# Patient Record
Sex: Female | Born: 1960
Health system: Southern US, Community
[De-identification: ages and names within clinical notes are randomized; demographics above are authoritative.]

## PROBLEM LIST (undated history)

## (undated) DIAGNOSIS — K219 Gastro-esophageal reflux disease without esophagitis: Secondary | ICD-10-CM

## (undated) DIAGNOSIS — R0602 Shortness of breath: Secondary | ICD-10-CM

## (undated) DIAGNOSIS — R51 Headache: Secondary | ICD-10-CM

## (undated) DIAGNOSIS — R519 Headache, unspecified: Secondary | ICD-10-CM

## (undated) DIAGNOSIS — M1990 Unspecified osteoarthritis, unspecified site: Secondary | ICD-10-CM

## (undated) HISTORY — PX: NO PAST SURGERIES: SHX2092

---

## 2005-05-09 ENCOUNTER — Emergency Department: Payer: Self-pay | Admitting: Emergency Medicine

## 2005-05-09 ENCOUNTER — Other Ambulatory Visit: Payer: Self-pay

## 2005-11-05 ENCOUNTER — Emergency Department: Payer: Self-pay | Admitting: Emergency Medicine

## 2005-11-05 ENCOUNTER — Other Ambulatory Visit: Payer: Self-pay

## 2007-07-13 ENCOUNTER — Emergency Department: Payer: Self-pay | Admitting: Emergency Medicine

## 2010-03-06 ENCOUNTER — Emergency Department: Payer: Self-pay | Admitting: Internal Medicine

## 2011-05-21 ENCOUNTER — Emergency Department: Payer: Self-pay | Admitting: Emergency Medicine

## 2012-06-30 ENCOUNTER — Emergency Department: Payer: Self-pay | Admitting: Emergency Medicine

## 2012-06-30 LAB — LIPASE, BLOOD: Lipase: 232 U/L (ref 73–393)

## 2012-06-30 LAB — URINALYSIS, COMPLETE
Blood: NEGATIVE
Glucose,UR: NEGATIVE mg/dL (ref 0–75)
Ketone: NEGATIVE
Nitrite: NEGATIVE
Ph: 5 (ref 4.5–8.0)
RBC,UR: 2 /HPF (ref 0–5)
WBC UR: 8 /HPF (ref 0–5)

## 2012-07-01 LAB — COMPREHENSIVE METABOLIC PANEL
Alkaline Phosphatase: 117 U/L (ref 50–136)
Anion Gap: 8 (ref 7–16)
Bilirubin,Total: 0.3 mg/dL (ref 0.2–1.0)
Chloride: 109 mmol/L — ABNORMAL HIGH (ref 98–107)
Co2: 24 mmol/L (ref 21–32)
Creatinine: 0.63 mg/dL (ref 0.60–1.30)
EGFR (Non-African Amer.): >60
Glucose: 67 mg/dL (ref 65–99)
Osmolality: 281 (ref 275–301)
Potassium: 3.3 mmol/L — ABNORMAL LOW (ref 3.5–5.1)
Total Protein: 8 g/dL (ref 6.4–8.2)

## 2014-02-23 ENCOUNTER — Emergency Department: Payer: Self-pay | Admitting: Emergency Medicine

## 2014-02-23 LAB — BASIC METABOLIC PANEL
ANION GAP: 7 (ref 7–16)
BUN: 16 mg/dL (ref 7–18)
Calcium, Total: 8.3 mg/dL — ABNORMAL LOW (ref 8.5–10.1)
Chloride: 110 mmol/L — ABNORMAL HIGH (ref 98–107)
Co2: 25 mmol/L (ref 21–32)
Creatinine: 0.73 mg/dL (ref 0.60–1.30)
EGFR (African American): >60
EGFR (Non-African Amer.): >60
GLUCOSE: 102 mg/dL — AB (ref 65–99)
Osmolality: 285 (ref 275–301)
Potassium: 3.6 mmol/L (ref 3.5–5.1)
Sodium: 142 mmol/L (ref 136–145)

## 2014-02-23 LAB — CBC
HCT: 38.6 % (ref 35.0–47.0)
HGB: 12.6 g/dL (ref 12.0–16.0)
MCH: 27.8 pg (ref 26.0–34.0)
MCHC: 32.5 g/dL (ref 32.0–36.0)
MCV: 86 fL (ref 80–100)
PLATELETS: 182 10*3/uL (ref 150–440)
RBC: 4.51 10*6/uL (ref 3.80–5.20)
RDW: 13 % (ref 11.5–14.5)
WBC: 5.7 10*3/uL (ref 3.6–11.0)

## 2014-02-23 LAB — TROPONIN I

## 2014-07-09 ENCOUNTER — Emergency Department (HOSPITAL_COMMUNITY): Payer: Medicaid Other

## 2014-07-09 ENCOUNTER — Emergency Department (HOSPITAL_COMMUNITY)
Admission: EM | Admit: 2014-07-09 | Discharge: 2014-07-09 | Disposition: A | Discharge status: Home / Self Care | Payer: Medicaid Other | Attending: Emergency Medicine | Admitting: Emergency Medicine

## 2014-07-09 ENCOUNTER — Encounter (HOSPITAL_COMMUNITY): Payer: Self-pay | Admitting: *Deleted

## 2014-07-09 DIAGNOSIS — X58XXXA Exposure to other specified factors, initial encounter: Secondary | ICD-10-CM | POA: Insufficient documentation

## 2014-07-09 DIAGNOSIS — R10A0 Flank pain, unspecified side: Secondary | ICD-10-CM

## 2014-07-09 DIAGNOSIS — Y9289 Other specified places as the place of occurrence of the external cause: Secondary | ICD-10-CM | POA: Diagnosis not present

## 2014-07-09 DIAGNOSIS — R109 Unspecified abdominal pain: Secondary | ICD-10-CM

## 2014-07-09 DIAGNOSIS — R079 Chest pain, unspecified: Secondary | ICD-10-CM | POA: Diagnosis not present

## 2014-07-09 DIAGNOSIS — Y9389 Activity, other specified: Secondary | ICD-10-CM | POA: Insufficient documentation

## 2014-07-09 DIAGNOSIS — R52 Pain, unspecified: Secondary | ICD-10-CM

## 2014-07-09 DIAGNOSIS — R112 Nausea with vomiting, unspecified: Secondary | ICD-10-CM | POA: Diagnosis not present

## 2014-07-09 DIAGNOSIS — K5900 Constipation, unspecified: Secondary | ICD-10-CM

## 2014-07-09 DIAGNOSIS — S40022A Contusion of left upper arm, initial encounter: Secondary | ICD-10-CM | POA: Diagnosis not present

## 2014-07-09 DIAGNOSIS — Z3202 Encounter for pregnancy test, result negative: Secondary | ICD-10-CM | POA: Diagnosis not present

## 2014-07-09 DIAGNOSIS — Y998 Other external cause status: Secondary | ICD-10-CM | POA: Insufficient documentation

## 2014-07-09 DIAGNOSIS — G8929 Other chronic pain: Secondary | ICD-10-CM

## 2014-07-09 DIAGNOSIS — R1030 Lower abdominal pain, unspecified: Secondary | ICD-10-CM | POA: Diagnosis not present

## 2014-07-09 HISTORY — DX: Shortness of breath: R06.02

## 2014-07-09 LAB — CBC
HCT: 40.5 % (ref 36.0–46.0)
Hemoglobin: 13.7 g/dL (ref 12.0–15.0)
MCH: 28.2 pg (ref 26.0–34.0)
MCHC: 33.8 g/dL (ref 30.0–36.0)
MCV: 83.5 fL (ref 78.0–100.0)
PLATELETS: 205 10*3/uL (ref 150–400)
RBC: 4.85 MIL/uL (ref 3.87–5.11)
RDW: 12.4 % (ref 11.5–15.5)
WBC: 5.3 10*3/uL (ref 4.0–10.5)

## 2014-07-09 LAB — BASIC METABOLIC PANEL
Anion gap: 11 (ref 5–15)
BUN: 21 mg/dL — ABNORMAL HIGH (ref 6–20)
CHLORIDE: 103 mmol/L (ref 101–111)
CO2: 25 mmol/L (ref 22–32)
CREATININE: 0.78 mg/dL (ref 0.44–1.00)
Calcium: 9.7 mg/dL (ref 8.9–10.3)
Glucose, Bld: 93 mg/dL (ref 65–99)
Potassium: 4 mmol/L (ref 3.5–5.1)
Sodium: 139 mmol/L (ref 135–145)

## 2014-07-09 LAB — URINALYSIS, ROUTINE W REFLEX MICROSCOPIC
Bilirubin Urine: NEGATIVE
GLUCOSE, UA: NEGATIVE mg/dL
HGB URINE DIPSTICK: NEGATIVE
Ketones, ur: NEGATIVE mg/dL
Leukocytes, UA: NEGATIVE
NITRITE: NEGATIVE
Protein, ur: NEGATIVE mg/dL
Specific Gravity, Urine: 1.028 (ref 1.005–1.030)
UROBILINOGEN UA: 0.2 mg/dL (ref 0.0–1.0)
pH: 5 (ref 5.0–8.0)

## 2014-07-09 LAB — I-STAT TROPONIN, ED
TROPONIN I, POC: 0.01 ng/mL (ref 0.00–0.08)
Troponin i, poc: 0.01 ng/mL (ref 0.00–0.08)

## 2014-07-09 LAB — PREGNANCY, URINE: Preg Test, Ur: NEGATIVE

## 2014-07-09 MED ORDER — GI COCKTAIL ~~LOC~~
30.0000 mL | Freq: Once | ORAL | Status: AC
  Administered 2014-07-09: 30 mL via ORAL
  Filled 2014-07-09: qty 30

## 2014-07-09 MED ORDER — POLYETHYLENE GLYCOL 3350 17 GM/SCOOP PO POWD: 17.0000 g | Freq: Two times a day (BID) | ORAL | Status: DC

## 2014-07-09 MED ORDER — OMEPRAZOLE 20 MG PO CPDR: 20.0000 mg | DELAYED_RELEASE_CAPSULE | Freq: Every day | ORAL | Status: DC

## 2014-07-09 MED ORDER — KETOROLAC TROMETHAMINE 30 MG/ML IJ SOLN
30.0000 mg | Freq: Once | INTRAMUSCULAR | Status: AC
  Administered 2014-07-09: 30 mg via INTRAMUSCULAR
  Filled 2014-07-09: qty 1

## 2014-07-09 NOTE — ED Notes (Signed)
Pt's husband not at bedside to assist in translation. Pt does not want to use translator phone, would like husband present.

## 2014-07-09 NOTE — Discharge Instructions (Signed)
Please follow up with your primary care physician in 1-2 days. If you do not have one please call the [facility] number listed above. Please follow up with Dr. Verl Blalock, the heart doctor to schedule a follow up appointment.  Please read all discharge instructions and return precautions.   Chest Pain (Nonspecific) It is often hard to give a specific diagnosis for the cause of chest pain. There is always a chance that your pain could be related to something serious, such as a heart attack or a blood clot in the lungs. You need to follow up with your health care provider for further evaluation. CAUSES   Heartburn.  Pneumonia or bronchitis.  Anxiety or stress.  Inflammation around your heart (pericarditis) or lung (pleuritis or pleurisy).  A blood clot in the lung.  A collapsed lung (pneumothorax). It can develop suddenly on its own (spontaneous pneumothorax) or from trauma to the chest.  Shingles infection (herpes zoster virus). The chest wall is composed of bones, muscles, and cartilage. Any of these can be the source of the pain.  The bones can be bruised by injury.  The muscles or cartilage can be strained by coughing or overwork.  The cartilage can be affected by inflammation and become sore (costochondritis). DIAGNOSIS  Lab tests or other studies may be needed to find the cause of your pain. Your health care provider may have you take a test called an ambulatory electrocardiogram (ECG). An ECG records your heartbeat patterns over a 24-hour period. You may also have other tests, such as:  Transthoracic echocardiogram (TTE). During echocardiography, sound waves are used to evaluate how blood flows through your heart.  Transesophageal echocardiogram (TEE).  Cardiac monitoring. This allows your health care provider to monitor your heart rate and rhythm in real time.  Holter monitor. This is a portable device that records your heartbeat and can help diagnose heart  arrhythmias. It allows your health care provider to track your heart activity for several days, if needed.  Stress tests by exercise or by giving medicine that makes the heart beat faster. TREATMENT   Treatment depends on what may be causing your chest pain. Treatment may include:  Acid blockers for heartburn.  Anti-inflammatory medicine.  Pain medicine for inflammatory conditions.  Antibiotics if an infection is present.  You may be advised to change lifestyle habits. This includes stopping smoking and avoiding alcohol, caffeine, and chocolate.  You may be advised to keep your head raised (elevated) when sleeping. This reduces the chance of acid going backward from your stomach into your esophagus. Most of the time, nonspecific chest pain will improve within 2-3 days with rest and mild pain medicine.  HOME CARE INSTRUCTIONS   If antibiotics were prescribed, take them as directed. Finish them even if you start to feel better.  For the next few days, avoid physical activities that bring on chest pain. Continue physical activities as directed.  Do not use any tobacco products, including cigarettes, chewing tobacco, or electronic cigarettes.  Avoid drinking alcohol.  Only take medicine as directed by your health care provider.  Follow your health care provider's suggestions for further testing if your chest pain does not go away.  Keep any follow-up appointments you made. If you do not go to an appointment, you could develop lasting (chronic) problems with pain. If there is any problem keeping an appointment, call to reschedule. SEEK MEDICAL CARE IF:   Your chest pain does not go away, even after treatment.  You have a rash with blisters on your chest.  You have a fever. SEEK IMMEDIATE MEDICAL CARE IF:   You have increased chest pain or pain that spreads to your arm, neck, jaw, back, or abdomen.  You have shortness of breath.  You have an increasing cough, or you cough up  blood.  You have severe back or abdominal pain.  You feel nauseous or vomit.  You have severe weakness.  You faint.  You have chills. This is an emergency. Do not wait to see if the pain will go away. Get medical help at once. Call your local emergency services (911 in [place] Do not drive yourself to the hospital. MAKE SURE YOU:   Understand these instructions.  Will watch your condition.  Will get help right away if you are not doing well or get worse. Document Released: 11/08/2004 Document Revised: 02/03/2013 Document Reviewed: 09/04/2007 [facility] Patient Information 2015 [place], [place]. This information is not intended to replace advice given to you by your health care provider. Make sure you discuss any questions you have with your health care provider.  Abdominal Pain, Women Abdominal (stomach, pelvic, or belly) pain can be caused by many things. It is important to tell your doctor:  The location of the pain.  Does it come and go or is it present all the time?  Are there things that start the pain (eating certain foods, exercise)?  Are there other symptoms associated with the pain (fever, nausea, vomiting, diarrhea)? All of this is helpful to know when trying to find the cause of the pain. CAUSES   Stomach: virus or bacteria infection, or ulcer.  Intestine: appendicitis (inflamed appendix), regional ileitis (Crohn's disease), ulcerative colitis (inflamed colon), irritable bowel syndrome, diverticulitis (inflamed diverticulum of the colon), or cancer of the stomach or intestine.  Gallbladder disease or stones in the gallbladder.  Kidney disease, kidney stones, or infection.  Pancreas infection or cancer.  Fibromyalgia (pain disorder).  Diseases of the female organs:  Uterus: fibroid (non-cancerous) tumors or infection.  Fallopian tubes: infection or tubal pregnancy.  Ovary: cysts or tumors.  Pelvic adhesions (scar tissue).  Endometriosis (uterus lining  tissue growing in the pelvis and on the pelvic organs).  Pelvic congestion syndrome (female organs filling up with blood just before the menstrual period).  Pain with the menstrual period.  Pain with ovulation (producing an egg).  Pain with an IUD (intrauterine device, birth control) in the uterus.  Cancer of the female organs.  Functional pain (pain not caused by a disease, may improve without treatment).  Psychological pain.  Depression. DIAGNOSIS  Your doctor will decide the seriousness of your pain by doing an examination.  Blood tests.  X-rays.  Ultrasound.  CT scan (computed tomography, special type of X-ray).  MRI (magnetic resonance imaging).  Cultures, for infection.  Barium enema (dye inserted in the large intestine, to better view it with X-rays).  Colonoscopy (looking in intestine with a lighted tube).  Laparoscopy (minor surgery, looking in abdomen with a lighted tube).  Major abdominal exploratory surgery (looking in abdomen with a large incision). TREATMENT  The treatment will depend on the cause of the pain.   Many cases can be observed and treated at home.  Over-the-counter medicines recommended by your caregiver.  Prescription medicine.  Antibiotics, for infection.  Birth control pills, for painful periods or for ovulation pain.  Hormone treatment, for endometriosis.  Nerve blocking injections.  Physical therapy.  Antidepressants.  Counseling with a psychologist or psychiatrist.  Minor or major surgery. HOME CARE INSTRUCTIONS   Do not take laxatives, unless directed by your caregiver.  Take over-the-counter pain medicine only if ordered by your caregiver. Do not take aspirin because it can cause an upset stomach or bleeding.  Try a clear liquid diet (broth or water) as ordered by your caregiver. Slowly move to a bland diet, as tolerated, if the pain is related to the stomach or intestine.  Have a thermometer and take your  temperature several times a day, and record it.  Bed rest and sleep, if it helps the pain.  Avoid sexual intercourse, if it causes pain.  Avoid stressful situations.  Keep your follow-up appointments and tests, as your caregiver orders.  If the pain does not go away with medicine or surgery, you may try:  Acupuncture.  Relaxation exercises (yoga, meditation).  Group therapy.  Counseling. SEEK MEDICAL CARE IF:   You notice certain foods cause stomach pain.  Your home care treatment is not helping your pain.  You need stronger pain medicine.  You want your IUD removed.  You feel faint or lightheaded.  You develop nausea and vomiting.  You develop a rash.  You are having side effects or an allergy to your medicine. SEEK IMMEDIATE MEDICAL CARE IF:   Your pain does not go away or gets worse.  You have a fever.  Your pain is felt only in portions of the abdomen. The right side could possibly be appendicitis. The left lower portion of the abdomen could be colitis or diverticulitis.  You are passing blood in your stools (bright red or black tarry stools, with or without vomiting).  You have blood in your urine.  You develop chills, with or without a fever.  You pass out. MAKE SURE YOU:   Understand these instructions.  Will watch your condition.  Will get help right away if you are not doing well or get worse. Document Released: 11/26/2006 Document Revised: 06/15/2013 Document Reviewed: 12/16/2008 [facility] Patient Information 2015 [place], [place]. This information is not intended to replace advice given to you by your health care provider. Make sure you discuss any questions you have with your health care provider.

## 2014-07-09 NOTE — ED Provider Notes (Signed)
CSN: [id number]     Arrival date & time 07/09/14  1115 History   First MD Initiated Contact with Patient 07/09/14 1349     Chief Complaint  Patient presents with  . Chest Pain  . Flank Pain     (Consider location/radiation/quality/duration/timing/severity/associated sxs/prior Treatment) HPI Comments: Patient is 54 year old female presenting to the emergency department for multiple complaints. Patient is complaining of central chest pain, burning, constant over the last 3 months. Patient describes it as starting in her belly button and going up to her chest. She states she has been seen twice before with negative cardiac workups and was advised to follow-up with PCP or cardiologist outpatient but has been unable to do so. Denies any changes in her symptoms today. She is also complaining of left flank pain that has been going on and off for a year. She states she's also having associated constipation with this as well as nausea and intermittent vomiting. No vomiting the last few days. Patient is also concerned about a small bruise and swelling in her left elbow. She states that began since she left last ER. No modifying factors identified.  Patient is a 54 y.o. female presenting with chest pain and flank pain.  Chest Pain Associated symptoms: nausea and vomiting   Flank Pain Associated symptoms include chest pain, myalgias, nausea and vomiting.    Past Medical History  Diagnosis Date  . SOB (shortness of breath)    History reviewed. No pertinent past surgical history. No family history on file. History  Substance Use Topics  . Smoking status: Never Smoker   . Smokeless tobacco: Not on file  . Alcohol Use: No   OB History    No data available     Review of Systems  Cardiovascular: Positive for chest pain.  Gastrointestinal: Positive for nausea and vomiting.  Genitourinary: Positive for flank pain.  Musculoskeletal: Positive for myalgias.  All other systems reviewed and are  negative.     Allergies  Review of patient's allergies indicates no known allergies.  Home Medications   Prior to Admission medications   Medication Sig Start Date End Date Taking? Authorizing Provider  omeprazole (PRILOSEC) 20 MG capsule Take 1 capsule (20 mg total) by mouth daily. 07/09/14   Kadyn Chovan, PA-C  polyethylene glycol powder (GLYCOLAX/MIRALAX) powder Take 17 g by mouth 2 (two) times daily. Until daily soft stools  OTC 07/09/14   Riki Gehring, PA-C   BP 113/74 mmHg  Pulse 58  Temp(Src) 98.2 F (36.8 C) (Oral)  Resp 18  Ht 5\' 8"  (1.727 m)  Wt 209 lb 7 oz (95 kg)  BMI 31.85 kg/m2  SpO2 100%  LMP 06/25/2014 Physical Exam  Constitutional: She is oriented to person, place, and time. She appears well-developed and well-nourished. No distress.  HENT:  Head: Normocephalic and atraumatic.  Right Ear: External ear normal.  Left Ear: External ear normal.  Nose: Nose normal.  Mouth/Throat: Oropharynx is clear and moist.  Eyes: Conjunctivae are normal.  Neck: Normal range of motion. Neck supple.  No nuchal rigidity.   Cardiovascular: Normal rate, regular rhythm and normal heart sounds.   Pulmonary/Chest: Effort normal and breath sounds normal. No respiratory distress.  Abdominal: Soft. Bowel sounds are normal. There is no tenderness.  Musculoskeletal: Normal range of motion. She exhibits no edema.       Arms: Neurological: She is alert and oriented to person, place, and time.  Skin: Skin is warm and dry. She is not diaphoretic.  Psychiatric:  She has a normal mood and affect.  Nursing note and vitals reviewed.   ED Course  Procedures (including critical care time) Medications  gi cocktail (Maalox,Lidocaine,Donnatal) (30 mLs Oral Given 07/09/14 1458)  ketorolac (TORADOL) 30 MG/ML injection 30 mg (30 mg Intramuscular Given 07/09/14 1501)    Labs Review Labs Reviewed  BASIC METABOLIC PANEL - Abnormal; Notable for the following:    BUN 21 (*)    All  other components within normal limits  URINALYSIS, ROUTINE W REFLEX MICROSCOPIC (NOT AT [facility]) - Abnormal; Notable for the following:    Color, Urine AMBER (*)    All other components within normal limits  CBC  PREGNANCY, URINE  I-STAT TROPOININ, ED  I-STAT TROPOININ, ED    Imaging Review Dg Chest 2 View  07/09/2014   CLINICAL DATA:  Central chest pain, left flank pain, constipation, left arm swelling, nausea and vomiting for 3 months.  EXAM: CHEST  2 VIEW  COMPARISON:  Radiographs 02/23/2014 and 07/01/2012.  FINDINGS: The heart size and mediastinal contours are stable allowing for lordotic positioning on the AP view. The lungs are clear. There is no pleural effusion or pneumothorax. No acute osseous findings are demonstrated.  IMPRESSION: No active cardiopulmonary process.   Electronically Signed   By: Richardean Sale M.D.   On: 07/09/2014 12:01   Dg Elbow Complete Left  07/09/2014   CLINICAL DATA:  Left elbow pain for 3 months.  No known injury.  EXAM: LEFT ELBOW - COMPLETE 3+ VIEW  COMPARISON:  None.  FINDINGS: The joint spaces are maintained. No acute fracture is identified. There is a rounded bony density near the lateral epicondyle which could reflect a T unfused secondary ossification center, old avulsion injury or calcific tendinitis. No osteochondral abnormality. No joint effusion.  IMPRESSION: No acute bony findings, significant degenerative changes or joint effusion.   Electronically Signed   By: Marijo Sanes M.D.   On: 07/09/2014 15:07   Ct Renal Stone Study  07/09/2014   CLINICAL DATA:  Bilateral flank pain.  EXAM: CT ABDOMEN AND PELVIS WITHOUT CONTRAST  TECHNIQUE: Multidetector CT imaging of the abdomen and pelvis was performed following the standard protocol without IV contrast.  COMPARISON:  None.  FINDINGS: Mild degenerative disc disease is noted at L4-5. Visualized lung bases appear normal.  No gallstones are noted. No focal abnormality is noted in the liver, spleen or pancreas on  these unenhanced images. Adrenal glands and kidneys appear normal. No hydronephrosis or renal obstruction is noted. No renal or ureteral calculi are noted. The appendix appears normal. There is no evidence of bowel obstruction. No abnormal fluid collection is noted. Urinary bladder appears normal. Uterus and ovaries appear normal. No significant adenopathy is noted.  IMPRESSION: No significant abnormality seen in the abdomen or pelvis. No hydronephrosis or renal obstruction is noted. No renal or ureteral calculi are noted.   Electronically Signed   By: Marijo Conception, M.D.   On: 07/09/2014 15:54     EKG Interpretation   Date/Time:  Friday Jul 09 2014 11:30:46 EDT Ventricular Rate:  84 PR Interval:  140 QRS Duration: 66 QT Interval:  350 QTC Calculation: 413 R Axis:   81 Text Interpretation:  Normal sinus rhythm Normal ECG Confirmed by BEATON   MD, ROBERT ([id number]) on 07/09/2014 4:19:31 PM      MDM   Final diagnoses:  Flank pain  Constipation, unspecified constipation type  Chronic chest pain    Filed Vitals:   07/09/14 1658  BP: 113/74  Pulse: 58  Temp: 98.2 F (36.8 C)  Resp: 18   Afebrile, NAD, non-toxic appearing, AAOx4. I have reviewed nursing notes, vital signs, and all appropriate lab and imaging results if ordered as above.   1) CP: Patient is to be discharged with recommendation to follow up with PCP/cardiology in regards to today's hospital visit for chest pain x 3 months. Chest pain is not likely of cardiac or pulmonary etiology d/t presentation, low suspicion for PE no hypoxia tachycardia or tachypnea, VSS, no tracheal deviation, no JVD or new murmur, RRR, breath sounds equal bilaterally, EKG without acute abnormalities, negative delta troponin, and negative CXR. Pt has been to return to the ED is CP becomes exertional, associated with diaphoresis or nausea, radiates to left jaw/arm, worsens or becomes concerning in any way.  2) Flank Pain: Abdomen soft, non-tender,  non-distended. No peritoneal signs. No CVA tenderness. UA without UTI. CT scan reviewed w/o acute abnormality. Will place patient on Miralax for constipation.   3) Left arm pain: Small hematoma noted. No evidence of superimposed infection. Patient X-Ray negative for obvious fracture or dislocation. Neurovascularly intact. Normal sensation. No evidence of compartment syndrome. Pain managed in ED. Pt advised to follow up with PCP if symptoms persist for possibility of missed fracture diagnosis.  Return precautions discussed. Patient is agreeable to plan. Patient is stable at time of discharge   Baron Sane, PA-C 07/09/14 [place], MD 07/13/14 [phone]

## 2014-07-09 NOTE — ED Notes (Signed)
Pt has multiple complaints. Pt reports central chest pain, left flank pain, constipation, left arm swelling and n/v. This has been ongoing and she has seen dr 3 months ago for same.

## 2014-08-04 ENCOUNTER — Encounter: Payer: Self-pay | Admitting: Cardiology

## 2014-08-04 ENCOUNTER — Ambulatory Visit: Payer: Medicaid Other | Attending: Cardiology | Admitting: Cardiology

## 2014-08-04 VITALS — BP 114/77 | HR 73 | Temp 98.1°F | Resp 18 | Ht 65.0 in | Wt 212.0 lb

## 2014-08-04 DIAGNOSIS — R0602 Shortness of breath: Secondary | ICD-10-CM | POA: Diagnosis not present

## 2014-08-04 DIAGNOSIS — M549 Dorsalgia, unspecified: Secondary | ICD-10-CM | POA: Diagnosis not present

## 2014-08-04 DIAGNOSIS — R079 Chest pain, unspecified: Secondary | ICD-10-CM | POA: Insufficient documentation

## 2014-08-04 DIAGNOSIS — Z79899 Other long term (current) drug therapy: Secondary | ICD-10-CM | POA: Insufficient documentation

## 2014-08-04 DIAGNOSIS — K219 Gastro-esophageal reflux disease without esophagitis: Secondary | ICD-10-CM | POA: Diagnosis not present

## 2014-08-04 MED ORDER — OMEPRAZOLE 20 MG PO CPDR: 20.0000 mg | DELAYED_RELEASE_CAPSULE | Freq: Every day | ORAL | Status: DC
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
## 2014-08-04 NOTE — Addendum Note (Signed)
Addended by: Sharman Crate A on: 08/04/2014 12:17 PM   Modules accepted: Orders

## 2014-08-04 NOTE — Progress Notes (Signed)
Patient reports chest pain for a very long time, 10 years. Chest pain is mid-sternal. Feels like an axe, first thing in the morning. Patient reports swelling right above belly button. Patient gets constipated, feels like something is moving in intestines. Left arm was swollen, patient reports sometimes left arm does not work, sometimes right arm does not work.   Free clinic in [facility] told patient she was pretty close to heart attack.   Husband passed 9 months ago.   Patient left medications at home from doctor in [place], causes nausea and vomiting.

## 2014-08-04 NOTE — Patient Instructions (Signed)
Thank you for coming in today. Please pick up your prescription for omeprazole at the pharmacy here. Make appointment with PCP to establish care.

## 2014-08-09 ENCOUNTER — Ambulatory Visit: Payer: Self-pay

## 2014-08-10 ENCOUNTER — Ambulatory Visit: Payer: Self-pay | Attending: Internal Medicine | Admitting: Clinical

## 2014-08-10 ENCOUNTER — Ambulatory Visit: Payer: Self-pay

## 2014-08-10 DIAGNOSIS — Z659 Problem related to unspecified psychosocial circumstances: Secondary | ICD-10-CM
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
## 2014-09-02 ENCOUNTER — Telehealth: Payer: Self-pay | Admitting: Cardiology

## 2014-09-02 NOTE — Telephone Encounter (Signed)
Patient called to request a med refill for Prilosec, please f/u with pt.

## 2014-09-07 ENCOUNTER — Ambulatory Visit: Payer: Self-pay

## 2014-10-04 LAB — BASIC METABOLIC PANEL
BUN: 26 — AB (ref 4–21)
Glucose: 103

## 2014-10-27 ENCOUNTER — Telehealth: Payer: Self-pay | Admitting: Internal Medicine

## 2014-10-27 NOTE — Telephone Encounter (Signed)
Patient's Son called requesting a medication refill for omeprazole (PRILOSEC). Please follow up.

## 2015-01-04 ENCOUNTER — Ambulatory Visit: Payer: Medicaid Other | Admitting: Family Medicine

## 2015-01-10 ENCOUNTER — Ambulatory Visit: Payer: Medicaid Other | Attending: Family Medicine | Admitting: Family Medicine

## 2015-01-10 ENCOUNTER — Encounter: Payer: Self-pay | Admitting: Family Medicine

## 2015-01-10 ENCOUNTER — Encounter: Payer: Self-pay | Admitting: Clinical

## 2015-01-10 VITALS — BP 120/82 | HR 70 | Temp 98.2°F | Resp 14 | Ht 66.0 in | Wt 214.2 lb

## 2015-01-10 DIAGNOSIS — F4310 Post-traumatic stress disorder, unspecified: Secondary | ICD-10-CM | POA: Diagnosis not present

## 2015-01-10 DIAGNOSIS — K5900 Constipation, unspecified: Secondary | ICD-10-CM | POA: Diagnosis not present

## 2015-01-10 DIAGNOSIS — Z13220 Encounter for screening for lipoid disorders: Secondary | ICD-10-CM

## 2015-01-10 DIAGNOSIS — K219 Gastro-esophageal reflux disease without esophagitis: Secondary | ICD-10-CM | POA: Insufficient documentation

## 2015-01-10 DIAGNOSIS — F329 Major depressive disorder, single episode, unspecified: Secondary | ICD-10-CM

## 2015-01-10 DIAGNOSIS — R1084 Generalized abdominal pain: Secondary | ICD-10-CM | POA: Diagnosis not present

## 2015-01-10 DIAGNOSIS — F419 Anxiety disorder, unspecified: Principal | ICD-10-CM

## 2015-01-10 DIAGNOSIS — R109 Unspecified abdominal pain: Secondary | ICD-10-CM | POA: Insufficient documentation

## 2015-01-10 MED ORDER — LACTULOSE 10 GM/15ML PO SOLN: 10.0000 g | Freq: Three times a day (TID) | ORAL | Status: DC

## 2015-01-10 NOTE — Patient Instructions (Signed)
Food Choices for Gastroesophageal Reflux Disease, Adult When you have gastroesophageal reflux disease (GERD), the foods you eat and your eating habits are very important. Choosing the right foods can help ease the discomfort of GERD. WHAT GENERAL GUIDELINES DO I NEED TO FOLLOW?  Choose fruits, vegetables, whole grains, low-fat dairy products, and low-fat meat, fish, and poultry.  Limit fats such as oils, salad dressings, butter, nuts, and avocado.  Keep a food diary to identify foods that cause symptoms.  Avoid foods that cause reflux. These may be different for different people.  Eat frequent small meals instead of three large meals each day.  Eat your meals slowly, in a relaxed setting.  Limit fried foods.  Cook foods using methods other than frying.  Avoid drinking alcohol.  Avoid drinking large amounts of liquids with your meals.  Avoid bending over or lying down until 2-3 hours after eating. WHAT FOODS ARE NOT RECOMMENDED? The following are some foods and drinks that may worsen your symptoms: Vegetables Tomatoes. Tomato juice. Tomato and spaghetti sauce. Chili peppers. Onion and garlic. Horseradish. Fruits Oranges, grapefruit, and lemon (fruit and juice). Meats High-fat meats, fish, and poultry. This includes hot dogs, ribs, ham, sausage, salami, and bacon. Dairy Whole milk and chocolate milk. Sour cream. Cream. Butter. Ice cream. Cream cheese.  Beverages Coffee and tea, with or without caffeine. Carbonated beverages or energy drinks. Condiments Hot sauce. Barbecue sauce.  Sweets/Desserts Chocolate and cocoa. Donuts. Peppermint and spearmint. Fats and Oils High-fat foods, including French fries and potato chips. Other Vinegar. Strong spices, such as black pepper, white pepper, red pepper, cayenne, curry powder, cloves, ginger, and chili powder. The items listed above may not be a complete list of foods and beverages to avoid. Contact your dietitian for more  information.   This information is not intended to replace advice given to you by your health care provider. Make sure you discuss any questions you have with your health care provider.   Document Released: 01/29/2005 Document Revised: 02/19/2014 Document Reviewed: 12/03/2012 Elsevier Interactive Patient Education 2016 Elsevier Inc.  
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
## 2015-01-10 NOTE — Progress Notes (Unsigned)
ASSESSMENT: Pt currently experiencing symptoms of anxiety and depression. Pt needs to f/u with PCP and [facility]; would benefit from continued supportive counseling regarding coping with symptoms of anxiety and depression, as well as referral to psychiatry for [facility] med management.  Stage of Change: precontemplative  PLAN: 1. F/U with behavioral health consultant at next office visit w physician 2. Psychiatric Medications: none. 3. Behavioral recommendation(s):   -Consider walk-in clinic at [facility] for [facility] med management -Consider accepting Home Health aide by next office visit, if physician advises SUBJECTIVE: Pt. referred by Dr Jarold Song for symptoms of anxiety and depression:  Pt. reports the following symptoms/concerns: Pt states that her primary source of depression and anxiety are that she worries about her health. She feels somewhat hopeful in that her Medicaid has been approved, but she is uncertain what that means as far as obtaining a PCP and referrals to a specialist who can put a camera in her chest, to make sure she doesn't have cancer. She also worries that her son is not married; her son says he is concerned that there are no sisters or wives in the family to take care of his mother while he is at work.  Duration of problem: Recent since Medicaid has been approved Severity: severe  OBJECTIVE: Orientation & Cognition: Oriented x3. Thought processes normal and appropriate to situation. Mood: appropriate. Affect: appropriate Appearance: appropriate Risk of harm to self or others: no known risk of harm to self or others, no suicidal ideation Substance use: none Assessments administered: PHQ9: 25/ GAD7: 21  Diagnosis: Anxiety and depression CPT Code: F41.8 -------------------------------------------- Other(s) present in the room: grown son  Time spent with patient in exam room: 20 minutes
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
## 2015-01-11 ENCOUNTER — Encounter: Payer: Self-pay | Admitting: Physician Assistant

## 2015-01-11 ENCOUNTER — Other Ambulatory Visit: Payer: Medicaid Other

## 2015-01-11 DIAGNOSIS — F4310 Post-traumatic stress disorder, unspecified: Secondary | ICD-10-CM | POA: Insufficient documentation

## 2015-02-02 ENCOUNTER — Other Ambulatory Visit (INDEPENDENT_AMBULATORY_CARE_PROVIDER_SITE_OTHER): Payer: Medicaid Other

## 2015-02-02 ENCOUNTER — Ambulatory Visit (INDEPENDENT_AMBULATORY_CARE_PROVIDER_SITE_OTHER): Payer: Medicaid Other | Admitting: Physician Assistant

## 2015-02-02 ENCOUNTER — Encounter: Payer: Self-pay | Admitting: Physician Assistant

## 2015-02-02 VITALS — BP 116/70 | HR 78 | Ht 66.0 in | Wt 213.0 lb

## 2015-02-02 DIAGNOSIS — R112 Nausea with vomiting, unspecified: Secondary | ICD-10-CM

## 2015-02-02 DIAGNOSIS — G8929 Other chronic pain: Secondary | ICD-10-CM

## 2015-02-02 DIAGNOSIS — Z1211 Encounter for screening for malignant neoplasm of colon: Secondary | ICD-10-CM

## 2015-02-02 DIAGNOSIS — R1010 Upper abdominal pain, unspecified: Secondary | ICD-10-CM

## 2015-02-02 DIAGNOSIS — R1310 Dysphagia, unspecified: Secondary | ICD-10-CM

## 2015-02-02 DIAGNOSIS — K219 Gastro-esophageal reflux disease without esophagitis: Secondary | ICD-10-CM

## 2015-02-02 DIAGNOSIS — R1013 Epigastric pain: Secondary | ICD-10-CM

## 2015-02-02 DIAGNOSIS — K5900 Constipation, unspecified: Secondary | ICD-10-CM

## 2015-02-02 DIAGNOSIS — R1011 Right upper quadrant pain: Secondary | ICD-10-CM

## 2015-02-02 LAB — BASIC METABOLIC PANEL
BUN: 22 mg/dL (ref 6–23)
CALCIUM: 10 mg/dL (ref 8.4–10.5)
CO2: 29 meq/L (ref 19–32)
Chloride: 106 mEq/L (ref 96–112)
Creatinine, Ser: 0.74 mg/dL (ref 0.40–1.20)
GFR: 87.46 mL/min (ref >60.00)
GLUCOSE: 103 mg/dL — AB (ref 70–99)
Potassium: 5.1 mEq/L (ref 3.5–5.1)
SODIUM: 143 meq/L (ref 135–145)

## 2015-02-02 LAB — HEPATIC FUNCTION PANEL
ALT: 17 U/L (ref 0–35)
AST: 21 U/L (ref 0–37)
Albumin: 4.4 g/dL (ref 3.5–5.2)
Alkaline Phosphatase: 84 U/L (ref 39–117)
BILIRUBIN DIRECT: 0 mg/dL (ref 0.0–0.3)
BILIRUBIN TOTAL: 0.4 mg/dL (ref 0.2–1.2)
Total Protein: 7.5 g/dL (ref 6.0–8.3)

## 2015-02-02 LAB — URINALYSIS, ROUTINE W REFLEX MICROSCOPIC
Bilirubin Urine: NEGATIVE
HGB URINE DIPSTICK: NEGATIVE
Ketones, ur: NEGATIVE
NITRITE: NEGATIVE
RBC / HPF: NONE SEEN (ref 0–?)
Specific Gravity, Urine: >=1.03 — AB (ref 1.000–1.030)
TOTAL PROTEIN, URINE-UPE24: NEGATIVE
URINE GLUCOSE: NEGATIVE
UROBILINOGEN UA: 0.2 (ref 0.0–1.0)
pH: 5.5 (ref 5.0–8.0)

## 2015-02-02 LAB — CBC WITH DIFFERENTIAL/PLATELET
Basophils Absolute: 0 10*3/uL (ref 0.0–0.1)
Basophils Relative: 0.4 % (ref 0.0–3.0)
EOS PCT: 1.7 % (ref 0.0–5.0)
Eosinophils Absolute: 0.1 10*3/uL (ref 0.0–0.7)
HCT: 40.3 % (ref 36.0–46.0)
HEMOGLOBIN: 13.4 g/dL (ref 12.0–15.0)
LYMPHS PCT: 33.5 % (ref 12.0–46.0)
Lymphs Abs: 2 10*3/uL (ref 0.7–4.0)
MCHC: 33.3 g/dL (ref 30.0–36.0)
MCV: 84.2 fl (ref 78.0–100.0)
MONO ABS: 0.5 10*3/uL (ref 0.1–1.0)
MONOS PCT: 8.7 % (ref 3.0–12.0)
Neutro Abs: 3.3 10*3/uL (ref 1.4–7.7)
Neutrophils Relative %: 55.7 % (ref 43.0–77.0)
Platelets: 196 10*3/uL (ref 150.0–400.0)
RBC: 4.78 Mil/uL (ref 3.87–5.11)
RDW: 13.5 % (ref 11.5–15.5)
WBC: 6 10*3/uL (ref 4.0–10.5)

## 2015-02-02 LAB — LIPASE: Lipase: 27 U/L (ref 11.0–59.0)

## 2015-02-02 LAB — AMYLASE: Amylase: 50 U/L (ref 27–131)

## 2015-02-02 LAB — TSH: TSH: 0.84 u[IU]/mL (ref 0.35–4.50)

## 2015-02-02 MED ORDER — DOCUSATE CALCIUM 240 MG PO CAPS: 240.0000 mg | ORAL_CAPSULE | Freq: Every day | ORAL | Status: DC

## 2015-02-02 MED ORDER — NA SULFATE-K SULFATE-MG SULF 17.5-3.13-1.6 GM/177ML PO SOLN: 1.0000 | ORAL | Status: AC

## 2015-02-02 MED ORDER — PANTOPRAZOLE SODIUM 40 MG PO TBEC: 40.0000 mg | DELAYED_RELEASE_TABLET | Freq: Two times a day (BID) | ORAL | Status: DC
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
## 2015-02-02 NOTE — Patient Instructions (Signed)
You have been scheduled for a colonoscopy. Please follow written instructions given to you at your visit today.  Please pick up your prep supplies at the pharmacy within the next 1-3 days. If you use inhalers (even only as needed), please bring them with you on the day of your procedure.   You have been scheduled for a Barium Esophogram at [facility] (1st floor of the hospital) on 02-08-15 at 11:30am. Please arrive 15 minutes prior to your appointment for registration. Make certain not to have anything to eat or drink 6 hours prior to your test. If you need to reschedule for any reason, please contact radiology at [phone] to do so. __________________________________________________________________ A barium swallow is an examination that concentrates on views of the esophagus. This tends to be a double contrast exam (barium and two liquids which, when combined, create a gas to distend the wall of the oesophagus) or single contrast (non-ionic iodine based). The study is usually tailored to your symptoms so a good history is essential. Attention is paid during the study to the form, structure and configuration of the esophagus, looking for functional disorders (such as aspiration, dysphagia, achalasia, motility and reflux) EXAMINATION You may be asked to change into a gown, depending on the type of swallow being performed. A radiologist and radiographer will perform the procedure. The radiologist will advise you of the type of contrast selected for your procedure and direct you during the exam. You will be asked to stand, sit or lie in several different positions and to hold a small amount of fluid in your mouth before being asked to swallow while the imaging is performed .In some instances you may be asked to swallow barium coated marshmallows to assess the motility of a solid food bolus. The exam can be recorded as a digital or video fluoroscopy procedure. POST PROCEDURE It will take 1-2  days for the barium to pass through your system. To facilitate this, it is important, unless otherwise directed, to increase your fluids for the next 24-48hrs and to resume your normal diet.  This test typically takes about 30 minutes to perform. __________________________________________________________________________________  Regina Sparks have been scheduled for an abdominal ultrasound at [facility] (1st floor of hospital) on 02-04-2015 at 7:30am . Please arrive 15 minutes prior to your appointment for registration. Make certain not to have anything to eat or drink 6 hours prior to your appointment. Should you need to reschedule your appointment, please contact radiology at [phone]. This test typically takes about 30 minutes to perform.   We have sent medications to your pharmacy for you to pick up at your convenience.  Please stop taking the Omeprazole.  Your physician has requested that you go to the basement for lab work before leaving today.

## 2015-02-03 LAB — CULTURE, URINE COMPREHENSIVE
Colony Count: NO GROWTH
ORGANISM ID, BACTERIA: NO GROWTH

## 2015-02-03 NOTE — Progress Notes (Signed)
i agree with the above note, plan 

## 2015-02-04 ENCOUNTER — Ambulatory Visit (HOSPITAL_COMMUNITY): Payer: Medicaid Other

## 2015-02-08 ENCOUNTER — Ambulatory Visit (HOSPITAL_COMMUNITY): Admission: RE | Admit: 2015-02-08 | Payer: Medicaid Other | Source: Ambulatory Visit

## 2015-02-09 ENCOUNTER — Ambulatory Visit (HOSPITAL_COMMUNITY): Admission: RE | Admit: 2015-02-09 | Payer: Medicaid Other | Source: Ambulatory Visit

## 2015-02-09 ENCOUNTER — Ambulatory Visit (HOSPITAL_COMMUNITY): Payer: Medicaid Other

## 2015-02-16 ENCOUNTER — Ambulatory Visit (HOSPITAL_COMMUNITY)
Admission: RE | Admit: 2015-02-16 | Discharge: 2015-02-16 | Disposition: A | Discharge status: Home / Self Care | Payer: Medicaid Other | Source: Ambulatory Visit | Attending: Physician Assistant | Admitting: Physician Assistant

## 2015-02-16 DIAGNOSIS — K5900 Constipation, unspecified: Secondary | ICD-10-CM | POA: Diagnosis not present

## 2015-02-16 DIAGNOSIS — R1011 Right upper quadrant pain: Secondary | ICD-10-CM | POA: Diagnosis not present

## 2015-02-16 DIAGNOSIS — R1013 Epigastric pain: Secondary | ICD-10-CM

## 2015-02-16 DIAGNOSIS — Z1211 Encounter for screening for malignant neoplasm of colon: Secondary | ICD-10-CM

## 2015-02-16 DIAGNOSIS — K449 Diaphragmatic hernia without obstruction or gangrene: Secondary | ICD-10-CM | POA: Insufficient documentation

## 2015-02-16 DIAGNOSIS — R1310 Dysphagia, unspecified: Secondary | ICD-10-CM | POA: Insufficient documentation

## 2015-02-16 DIAGNOSIS — K219 Gastro-esophageal reflux disease without esophagitis: Secondary | ICD-10-CM | POA: Insufficient documentation

## 2015-02-16 DIAGNOSIS — G8929 Other chronic pain: Secondary | ICD-10-CM

## 2015-02-16 DIAGNOSIS — R112 Nausea with vomiting, unspecified: Secondary | ICD-10-CM | POA: Insufficient documentation

## 2015-02-16 DIAGNOSIS — K8020 Calculus of gallbladder without cholecystitis without obstruction: Secondary | ICD-10-CM | POA: Diagnosis not present

## 2015-02-16 DIAGNOSIS — R042 Hemoptysis: Secondary | ICD-10-CM | POA: Insufficient documentation

## 2015-02-18 ENCOUNTER — Ambulatory Visit (HOSPITAL_COMMUNITY): Payer: Medicaid Other

## 2015-02-23 ENCOUNTER — Ambulatory Visit (HOSPITAL_COMMUNITY)
Admission: RE | Admit: 2015-02-23 | Discharge: 2015-02-23 | Disposition: A | Discharge status: Home / Self Care | Payer: Medicaid Other | Source: Ambulatory Visit | Attending: Physician Assistant | Admitting: Physician Assistant

## 2015-02-23 DIAGNOSIS — R1013 Epigastric pain: Secondary | ICD-10-CM | POA: Diagnosis not present

## 2015-02-23 DIAGNOSIS — R109 Unspecified abdominal pain: Secondary | ICD-10-CM | POA: Diagnosis present

## 2015-02-23 DIAGNOSIS — Z1211 Encounter for screening for malignant neoplasm of colon: Secondary | ICD-10-CM | POA: Diagnosis not present

## 2015-02-23 DIAGNOSIS — R1310 Dysphagia, unspecified: Secondary | ICD-10-CM | POA: Diagnosis not present

## 2015-02-23 DIAGNOSIS — R079 Chest pain, unspecified: Secondary | ICD-10-CM | POA: Diagnosis not present

## 2015-02-23 DIAGNOSIS — R1010 Upper abdominal pain, unspecified: Secondary | ICD-10-CM | POA: Diagnosis not present

## 2015-02-23 DIAGNOSIS — R112 Nausea with vomiting, unspecified: Secondary | ICD-10-CM | POA: Diagnosis not present

## 2015-02-23 DIAGNOSIS — K219 Gastro-esophageal reflux disease without esophagitis: Secondary | ICD-10-CM | POA: Insufficient documentation

## 2015-02-23 DIAGNOSIS — K8020 Calculus of gallbladder without cholecystitis without obstruction: Secondary | ICD-10-CM | POA: Diagnosis not present

## 2015-02-23 DIAGNOSIS — K5900 Constipation, unspecified: Secondary | ICD-10-CM | POA: Insufficient documentation

## 2015-02-23 DIAGNOSIS — G8929 Other chronic pain: Secondary | ICD-10-CM | POA: Insufficient documentation

## 2015-03-09 ENCOUNTER — Ambulatory Visit: Payer: Self-pay | Admitting: General Surgery

## 2015-03-28 ENCOUNTER — Encounter: Payer: Self-pay | Admitting: *Deleted

## 2015-03-28 ENCOUNTER — Ambulatory Visit (AMBULATORY_SURGERY_CENTER): Payer: Medicaid Other | Admitting: Gastroenterology

## 2015-03-28 ENCOUNTER — Encounter: Payer: Self-pay | Admitting: Gastroenterology

## 2015-03-28 VITALS — BP 106/61 | HR 74 | Temp 98.1°F | Resp 20 | Ht 66.0 in | Wt 213.0 lb

## 2015-03-28 DIAGNOSIS — R109 Unspecified abdominal pain: Secondary | ICD-10-CM

## 2015-03-28 DIAGNOSIS — B9681 Helicobacter pylori [H. pylori] as the cause of diseases classified elsewhere: Secondary | ICD-10-CM | POA: Diagnosis not present

## 2015-03-28 DIAGNOSIS — R1310 Dysphagia, unspecified: Secondary | ICD-10-CM

## 2015-03-28 DIAGNOSIS — Z1211 Encounter for screening for malignant neoplasm of colon: Secondary | ICD-10-CM

## 2015-03-28 DIAGNOSIS — K2970 Gastritis, unspecified, without bleeding: Secondary | ICD-10-CM | POA: Diagnosis not present

## 2015-03-28 DIAGNOSIS — K5900 Constipation, unspecified: Secondary | ICD-10-CM

## 2015-03-28 DIAGNOSIS — K2950 Unspecified chronic gastritis without bleeding: Secondary | ICD-10-CM | POA: Diagnosis not present

## 2015-03-28 MED ORDER — SODIUM CHLORIDE 0.9 % IV SOLN: 500.0000 mL | INTRAVENOUS | Status: DC

## 2015-03-28 NOTE — Progress Notes (Signed)
To [place]ake with spont resp-report to RN

## 2015-03-28 NOTE — Progress Notes (Signed)
Called to room to assist during endoscopic procedure.  Patient ID and intended procedure confirmed with present staff. Received instructions for my participation in the procedure from the performing physician.  

## 2015-03-28 NOTE — Progress Notes (Signed)
Apparently, the prep was very bad.  The patient did not take the second part of her prep at all.  She was also not on a clear liquid diet per the translator. The translator stated that she should have been called, and that there was another one involved.  I explained to her that it was posted on the patient's chart to call a specific person due to the language barrier.  I also explained that the patient would be re-scheduled for another colonoscopy per the office.  She must be clear regarding the prep instructions or this would happen again.  Apparently, her son was supposed to tell her what to do.

## 2015-03-28 NOTE — Op Note (Signed)
[facility]  [address] [place], [id number]   COLONOSCOPY PROCEDURE REPORT  PATIENT: Regina, Sparks  MR#: [id number] BIRTHDATE: [birth year] , 54  yrs. old GENDER: female ENDOSCOPIST: Milus Banister, MD REFERRED BY:  Arnoldo Morale, MD PROCEDURE DATE:  03/28/2015 PROCEDURE:   Colonoscopy, diagnostic First Screening Colonoscopy - Avg.  risk and is 50 yrs.  old or older - No.  Prior Negative Screening - Now for repeat screening. N/A  History of Adenoma - Now for follow-up colonoscopy & has been > or = to 3 yrs.  N/A  poor prep ASA CLASS:   Class II INDICATIONS:constipation, abdominal pain. MEDICATIONS: Monitored anesthesia care and Propofol 100 mg IV  DESCRIPTION OF PROCEDURE:   After the risks benefits and alternatives of the procedure were thoroughly explained, informed consent was obtained.  The digital rectal exam revealed no abnormalities of the rectum.   The [id number]  endoscope was introduced through the anus and advanced to the splenic flexure. No adverse events experienced.   Limited by poor preparation.   The quality of the prep was good.  The instrument was then slowly withdrawn as the colon was fully examined. Estimated blood loss is zero unless otherwise noted in this procedure report.   COLON FINDINGS: Poor prep, this was an incomplete examination. Retroflexion was not performed due to a narrow rectal vault. The time to cecum = NA Withdrawal time = NA   The scope was withdrawn and the procedure completed. COMPLICATIONS: There were no immediate complications.  ENDOSCOPIC IMPRESSION: Poor prep, this was an incomplete examination  RECOMMENDATIONS: My office will contact you about repeat colonoscopy.  It will be very important that you follow the prep instructions exactly.  eSigned:  Milus Banister, MD 03/28/2015 2:29 PM

## 2015-03-28 NOTE — Patient Instructions (Signed)
YOU HAD AN ENDOSCOPIC PROCEDURE TODAY AT THE [facility]:   Refer to the procedure report that was given to you for any specific questions about what was found during the examination.  If the procedure report does not answer your questions, please call your gastroenterologist to clarify.  If you requested that your care partner not be given the details of your procedure findings, then the procedure report has been included in a sealed envelope for you to review at your convenience later.  YOU SHOULD EXPECT: Some feelings of bloating in the abdomen. Passage of more gas than usual.  Walking can help get rid of the air that was put into your GI tract during the procedure and reduce the bloating. If you had a lower endoscopy (such as a colonoscopy or flexible sigmoidoscopy) you may notice spotting of blood in your stool or on the toilet paper. If you underwent a bowel prep for your procedure, you may not have a normal bowel movement for a few days.  Please Note:  You might notice some irritation and congestion in your nose or some drainage.  This is from the oxygen used during your procedure.  There is no need for concern and it should clear up in a day or so.  SYMPTOMS TO REPORT IMMEDIATELY:   Following lower endoscopy (colonoscopy or flexible sigmoidoscopy):  Excessive amounts of blood in the stool  Significant tenderness or worsening of abdominal pains  Swelling of the abdomen that is new, acute  Fever of 100F or higher   Following upper endoscopy (EGD)  Vomiting of blood or coffee ground material  New chest pain or pain under the shoulder blades  Painful or persistently difficult swallowing  New shortness of breath  Fever of 100F or higher  Black, tarry-looking stools  For urgent or emergent issues, a gastroenterologist can be reached at any hour by calling [phone].   DIET: Your first meal following the procedure should be a small meal and then it is ok to progress to  your normal diet. Heavy or fried foods are harder to digest and may make you feel nauseous or bloated.  Likewise, meals heavy in dairy and vegetables can increase bloating.  Drink plenty of fluids but you should avoid alcoholic beverages for 24 hours.  ACTIVITY:  You should plan to take it easy for the rest of today and you should NOT DRIVE or use heavy machinery until tomorrow (because of the sedation medicines used during the test).    FOLLOW UP: Our staff will call the number listed on your records the next business day following your procedure to check on you and address any questions or concerns that you may have regarding the information given to you following your procedure. If we do not reach you, we will leave a message.  However, if you are feeling well and you are not experiencing any problems, there is no need to return our call.  We will assume that you have returned to your regular daily activities without incident.  If any biopsies were taken you will be contacted by phone or by letter within the next 1-3 weeks.  Please call us at [phone] if you have not heard about the biopsies in 3 weeks.    SIGNATURES/CONFIDENTIALITY: You and/or your care partner have signed paperwork which will be entered into your electronic medical record.  These signatures attest to the fact that that the information above on your After Visit Summary has been reviewed   and is understood.  Full responsibility of the confidentiality of this discharge information lies with you and/or your care-partner.  We were unable to do the colonoscopy due to a poor prep.  The office will call you to re-schedule with a 2 day prep.  You must follow the directions in order to complete the procedure.

## 2015-03-28 NOTE — Op Note (Signed)
[facility]  [address]. [place], [id number]   ENDOSCOPY PROCEDURE REPORT  PATIENT: Regina Sparks, Regina Sparks  MR#: [id number] BIRTHDATE: 24-Nov-1960 , 54  yrs. old GENDER: female ENDOSCOPIST: Milus Banister, MD PROCEDURE DATE:  03/28/2015 PROCEDURE:  EGD w/ biopsy ASA CLASS:     Class II INDICATIONS:  nausea, abdominal pain. MEDICATIONS: Monitored anesthesia care and Propofol 250 mg IV TOPICAL ANESTHETIC: none  DESCRIPTION OF PROCEDURE: After the risks benefits and alternatives of the procedure were thoroughly explained, informed consent was obtained.  The LB [id number] endoscope was introduced through the mouth and advanced to the second portion of the duodenum , Without limitations.  The instrument was slowly withdrawn as the mucosa was fully examined.   There was mild, non-specific distal gastritis.  The stomach was biopsied in antrum and body and sent to pathology.  There was a 1cm hiatal hernia.  The examination was otherwise normal.  Retroflexed views revealed no abnormalities.     The scope was then withdrawn from the patient and the procedure completed.  COMPLICATIONS: There were no immediate complications.  ENDOSCOPIC IMPRESSION: There was mild, non-specific distal gastritis.  The stomach was biopsied in antrum and body and sent to pathology.  There was a 1cm hiatal hernia.  The examination was otherwise normal  RECOMMENDATIONS: Await final pathology results.  eSigned:  Milus Banister, MD 03/28/2015 2:37 PM

## 2015-03-29 ENCOUNTER — Telehealth: Payer: Self-pay

## 2015-03-29 NOTE — Telephone Encounter (Signed)
  Follow up Call-  Call back number 03/28/2015  Post procedure Call Back phone  # [phone]  Permission to leave phone message Yes    Patient was called for follow up after procedure on 03/28/2015. No answer at the number given for follow up phone call. A message was left on her answering machine.

## 2015-03-30 ENCOUNTER — Telehealth: Payer: Self-pay | Admitting: Gastroenterology

## 2015-03-30 NOTE — Telephone Encounter (Signed)
Ok, thanks.

## 2015-03-30 NOTE — Telephone Encounter (Signed)
Had colon on 03/28/15 she is running a fever per the pt son, has a stiff neck with what feels like "shock" is in the back of her neck.  Also, headache since 03/29/15.  Pt has been advised to go to the ED for evaluation.  I will forward to Dr Ardis Hughs for review.

## 2015-03-31 ENCOUNTER — Telehealth: Payer: Self-pay

## 2015-03-31 NOTE — Telephone Encounter (Signed)
Needs repeat colon see 03/28/15 procedure

## 2015-04-05 NOTE — Telephone Encounter (Signed)
Stephanie scheduled the pt for previsit and colon

## 2015-04-07 ENCOUNTER — Other Ambulatory Visit: Payer: Self-pay

## 2015-04-07 MED ORDER — BISMUTH SUBSALICYLATE 262 MG PO TABS: 2.0000 | ORAL_TABLET | Freq: Four times a day (QID) | ORAL | Status: DC

## 2015-04-07 MED ORDER — METRONIDAZOLE 250 MG PO TABS: 500.0000 mg | ORAL_TABLET | Freq: Four times a day (QID) | ORAL | Status: DC

## 2015-04-07 MED ORDER — TETRACYCLINE HCL 500 MG PO CAPS: 500.0000 mg | ORAL_CAPSULE | Freq: Four times a day (QID) | ORAL | Status: DC

## 2015-04-07 MED FILL — OMEPRAZOLE DR 20 MG CAPSULE: 20 | 30 days supply | Qty: 30 | Fill #0

## 2015-04-07 MED FILL — metroNIDAZOLE 250 MG TABS: 250 | 14 days supply | Qty: 112 | Fill #0

## 2015-04-07 NOTE — Telephone Encounter (Signed)
Pt has been notified and prescription has been sent to the pharmacy.  Allergies verified.  Pt advised to take OTC omeprazole 20 mg daily while taking abx.

## 2015-05-06 ENCOUNTER — Ambulatory Visit (AMBULATORY_SURGERY_CENTER): Payer: Self-pay | Admitting: *Deleted

## 2015-05-06 VITALS — Ht 65.0 in | Wt 222.0 lb

## 2015-05-06 DIAGNOSIS — Z1211 Encounter for screening for malignant neoplasm of colon: Secondary | ICD-10-CM

## 2015-05-06 MED FILL — OMEPRAZOLE DR 20 MG CAPSULE: 20 | 30 days supply | Qty: 30 | Fill #1

## 2015-05-06 NOTE — Progress Notes (Signed)
No egg or soy allergy. No anesthesia problems.  No home O2.  No diet meds.  

## 2015-05-17 ENCOUNTER — Encounter: Payer: Self-pay | Admitting: Gastroenterology

## 2015-05-17 ENCOUNTER — Ambulatory Visit (AMBULATORY_SURGERY_CENTER): Payer: Medicaid Other | Admitting: Gastroenterology

## 2015-05-17 ENCOUNTER — Telehealth: Payer: Self-pay | Admitting: Gastroenterology

## 2015-05-17 VITALS — BP 111/71 | HR 65 | Temp 98.4°F | Resp 11 | Ht 65.0 in | Wt 222.0 lb

## 2015-05-17 DIAGNOSIS — D124 Benign neoplasm of descending colon: Secondary | ICD-10-CM

## 2015-05-17 DIAGNOSIS — Z1211 Encounter for screening for malignant neoplasm of colon: Secondary | ICD-10-CM | POA: Diagnosis not present

## 2015-05-17 MED ORDER — ESOMEPRAZOLE MAGNESIUM 40 MG PO PACK: 40.0000 mg | PACK | Freq: Two times a day (BID) | ORAL | Status: DC

## 2015-05-17 MED ORDER — METRONIDAZOLE 250 MG PO TABS: 500.0000 mg | ORAL_TABLET | Freq: Four times a day (QID) | ORAL | Status: DC

## 2015-05-17 MED ORDER — SODIUM CHLORIDE 0.9 % IV SOLN: 500.0000 mL | INTRAVENOUS | Status: DC

## 2015-05-17 MED ORDER — TETRACYCLINE HCL 500 MG PO CAPS
500.0000 mg | ORAL_CAPSULE | Freq: Four times a day (QID) | ORAL | Status: DC
Start: 2015-05-17 — End: 2016-05-14

## 2015-05-17 MED FILL — metroNIDAZOLE 250 MG TABS: 250 | 14 days supply | Qty: 112 | Fill #0

## 2015-05-17 NOTE — Progress Notes (Signed)
Patient awakening,vss,report to rn 

## 2015-05-17 NOTE — Progress Notes (Signed)
Called to room to assist during endoscopic procedure.  Patient ID and intended procedure confirmed with present staff. Received instructions for my participation in the procedure from the performing physician.  

## 2015-05-17 NOTE — Patient Instructions (Signed)
YOU HAD AN ENDOSCOPIC PROCEDURE TODAY AT [facility]:   Refer to the procedure report that was given to you for any specific questions about what was found during the examination.  If the procedure report does not answer your questions, please call your gastroenterologist to clarify.  If you requested that your care partner not be given the details of your procedure findings, then the procedure report has been included in a sealed envelope for you to review at your convenience later.  YOU SHOULD EXPECT: Some feelings of bloating in the abdomen. Passage of more gas than usual.  Walking can help get rid of the air that was put into your GI tract during the procedure and reduce the bloating. If you had a lower endoscopy (such as a colonoscopy or flexible sigmoidoscopy) you may notice spotting of blood in your stool or on the toilet paper. If you underwent a bowel prep for your procedure, you may not have a normal bowel movement for a few days.  Please Note:  You might notice some irritation and congestion in your nose or some drainage.  This is from the oxygen used during your procedure.  There is no need for concern and it should clear up in a day or so.  SYMPTOMS TO REPORT IMMEDIATELY:   Following lower endoscopy (colonoscopy or flexible sigmoidoscopy):  Excessive amounts of blood in the stool  Significant tenderness or worsening of abdominal pains  Swelling of the abdomen that is new, acute  Fever of 100F or higher    For urgent or emergent issues, a gastroenterologist can be reached at any hour by calling [phone].   DIET: Your first meal following the procedure should be a small meal and then it is ok to progress to your normal diet. Heavy or fried foods are harder to digest and may make you feel nauseous or bloated.  Likewise, meals heavy in dairy and vegetables can increase bloating.  Drink plenty of fluids but you should avoid alcoholic beverages for 24  hours.  ACTIVITY:  You should plan to take it easy for the rest of today and you should NOT DRIVE or use heavy machinery until tomorrow (because of the sedation medicines used during the test).    FOLLOW UP: Our staff will call the number listed on your records the next business day following your procedure to check on you and address any questions or concerns that you may have regarding the information given to you following your procedure. If we do not reach you, we will leave a message.  However, if you are feeling well and you are not experiencing any problems, there is no need to return our call.  We will assume that you have returned to your regular daily activities without incident.  If any biopsies were taken you will be contacted by phone or by letter within the next 1-3 weeks.  Please call us at [phone] if you have not heard about the biopsies in 3 weeks.    SIGNATURES/CONFIDENTIALITY: You and/or your care partner have signed paperwork which will be entered into your electronic medical record.  These signatures attest to the fact that that the information above on your After Visit Summary has been reviewed and is understood.  Full responsibility of the confidentiality of this discharge information lies with you and/or your care-partner.  Stop old Hpylori medication and start new one that was sent in, resume remainder of medication. Information given on polyps.

## 2015-05-17 NOTE — Op Note (Signed)
[place] [place] Patient Name: Regina Sparks Procedure Date: 05/17/2015 1:20 PM MRN: [id number] Endoscopist: Milus Banister , MD Age: 55 Referring MD:  Date of Birth: 1960/02/28 Gender: Female Procedure:                Colonoscopy Indications:              Generalized abdominal pain, incomplete examination                            2 months ago due to poor prep Medicines:                Monitored Anesthesia Care Procedure:                Pre-Anesthesia Assessment:                           - Prior to the procedure, a History and Physical                            was performed, and patient medications and                            allergies were reviewed. The patient's tolerance of                            previous anesthesia was also reviewed. The risks                            and benefits of the procedure and the sedation                            options and risks were discussed with the patient.                            All questions were answered, and informed consent                            was obtained. Prior Anticoagulants: The patient has                            taken no previous anticoagulant or antiplatelet                            agents. ASA Grade Assessment: II - A patient with                            mild systemic disease. After reviewing the risks                            and benefits, the patient was deemed in                            satisfactory condition to undergo the procedure.  After obtaining informed consent, the colonoscope                            was passed under direct vision. Throughout the                            procedure, the patient's blood pressure, pulse, and                            oxygen saturations were monitored continuously. The                            Model [id number]) scope was introduced                            through the anus and advanced to the the cecum,                           identified by appendiceal orifice and ileocecal                            valve. The colonoscopy was performed without                            difficulty. The patient tolerated the procedure                            well. The quality of the bowel preparation was                            good. The ileocecal valve, appendiceal orifice, and                            rectum were photographed. Scope In: 1:37:48 PM Scope Out: 1:49:36 PM Scope Withdrawal Time: 0 hours 8 minutes 27 seconds  Total Procedure Duration: 0 hours 11 minutes 48 seconds  Findings:      A 6 mm polyp was found in the descending colon. The polyp was sessile.       The polyp was removed with a cold snare. Resection and retrieval were       complete.      The exam was otherwise without abnormality on direct and retroflexion       views. Complications:            No immediate complications. Estimated blood loss:                            None. Estimated Blood Loss:     Estimated blood loss: none. Impression:               - One 6 mm polyp in the descending colon, removed                            with a cold snare. Resected and retrieved.                           -  The examination was otherwise normal on direct                            and retroflexion views. Recommendation:           - Patient has a contact number available for                            emergencies. The signs and symptoms of potential                            delayed complications were discussed with the                            patient. Return to normal activities tomorrow.                            Written discharge instructions were provided to the                            patient.                           - Resume previous diet.                           - Continue present medications.                           You will receive a letter within 2-3 weeks with the                            pathology  results and my final recommendations.                           If the polyp(s) is proven to be 'pre-cancerous' on                            pathology, you will need repeat colonoscopy in 5                            years. If the polyp(s) is NOT 'precancerous' on                            pathology then you should repeat colon cancer                            screening in 10 years with colonoscopy without need                            for colon cancer screening by any method prior to                            then (including stool testing).  Dr. Ardis Hughs' office will call in new prescription                            for H. pylori treatment. Please stop the old                            prescription and start the new one only. Also will                            call in a new prescription antiacid medicine (twice                            daily). Milus Banister, MD 05/17/2015 1:53:35 PM This report has been signed electronically. Number of Addenda: 0 Referring MD:      [name]

## 2015-05-18 ENCOUNTER — Telehealth: Payer: Self-pay | Admitting: *Deleted

## 2015-05-18 NOTE — Telephone Encounter (Signed)
Prior Regina Sparks will be done, the pharmacy was notified

## 2015-05-18 NOTE — Telephone Encounter (Signed)
Message left on f/u call °

## 2015-05-19 NOTE — Telephone Encounter (Signed)
Pt's son is aware that the prior Josem Kaufmann has been completed and we are waiting for insurance response.  I will call after we get an answer.  If the medication is still to expensive I will contact Dr Ardis Hughs about alternatives.

## 2015-05-19 NOTE — Telephone Encounter (Signed)
Needing to discuss the meds.   Call son [phone]

## 2015-05-20 ENCOUNTER — Telehealth: Payer: Self-pay | Admitting: Gastroenterology

## 2015-05-20 NOTE — Telephone Encounter (Signed)
Pt has been notified that a response has not been received from [employer] as of now. I will call when we get a response.

## 2015-05-23 ENCOUNTER — Telehealth: Payer: Self-pay | Admitting: Gastroenterology

## 2015-05-25 NOTE — Telephone Encounter (Signed)
I spoke with the insurance company and they stated that the tertcycline was approved and the nexium does not require a PA.  I called and notified the son of the pt and he will call the pharmacy and notify me if there is a problem.

## 2015-05-26 MED FILL — TETRACYCLINE 500 MG CAPSULE: 500 | 28 days supply | Qty: 56 | Fill #0

## 2015-05-26 MED FILL — ?ESOMEPRAZOLE MAG DR 40 MG: 40 MG | 30 days supply | Qty: 60 | Fill #0

## 2015-05-27 ENCOUNTER — Encounter: Payer: Self-pay | Admitting: Gastroenterology

## 2015-07-12 ENCOUNTER — Telehealth: Payer: Self-pay | Admitting: Family Medicine

## 2015-07-12 NOTE — Telephone Encounter (Signed)
Patient is needing a letter from pcp stating that she is only able to perform "light" work and preferably on a part time basis. She has applied for disability and they don't allow for her to earn more than 1,000/month in order to continue earning income through disability.  After her husband past away she is having a lot of trouble supporting herself financially and is interested in a part time job that does not require a lot of lifting and heavy duty work. I have referred her to [facility] for career counseling and asked that she call us this Thursday morning to obtain an appointment with you for an evaluation of her limitations.

## 2015-07-13 NOTE — Telephone Encounter (Signed)
Will address at her office visit 

## 2015-07-15 ENCOUNTER — Encounter: Payer: Self-pay | Admitting: Gastroenterology

## 2015-07-15 NOTE — Telephone Encounter (Signed)
Error

## 2015-07-19 ENCOUNTER — Telehealth: Payer: Self-pay | Admitting: Gastroenterology

## 2015-07-19 MED ORDER — OMEPRAZOLE 40 MG PO CPDR: 40.0000 mg | DELAYED_RELEASE_CAPSULE | Freq: Every day | ORAL | Status: DC

## 2015-07-19 NOTE — Telephone Encounter (Signed)
Prescription sent as requested by pt and insurance company

## 2016-03-04 ENCOUNTER — Emergency Department (HOSPITAL_COMMUNITY): Payer: Medicaid Other

## 2016-03-04 ENCOUNTER — Encounter (HOSPITAL_COMMUNITY): Payer: Self-pay | Admitting: Emergency Medicine

## 2016-03-04 DIAGNOSIS — R1010 Upper abdominal pain, unspecified: Secondary | ICD-10-CM | POA: Diagnosis present

## 2016-03-04 DIAGNOSIS — K8020 Calculus of gallbladder without cholecystitis without obstruction: Secondary | ICD-10-CM | POA: Insufficient documentation

## 2016-03-04 DIAGNOSIS — R0789 Other chest pain: Secondary | ICD-10-CM | POA: Insufficient documentation

## 2016-03-04 LAB — I-STAT TROPONIN, ED: Troponin i, poc: 0 ng/mL (ref 0.00–0.08)

## 2016-03-04 LAB — COMPREHENSIVE METABOLIC PANEL
ALBUMIN: 4.2 g/dL (ref 3.5–5.0)
ALK PHOS: 84 U/L (ref 38–126)
ALT: 13 U/L — AB (ref 14–54)
AST: 22 U/L (ref 15–41)
Anion gap: 10 (ref 5–15)
BUN: 13 mg/dL (ref 6–20)
CHLORIDE: 104 mmol/L (ref 101–111)
CO2: 24 mmol/L (ref 22–32)
CREATININE: 0.76 mg/dL (ref 0.44–1.00)
Calcium: 10 mg/dL (ref 8.9–10.3)
GFR calc non Af Amer: >60 mL/min (ref >60)
GLUCOSE: 126 mg/dL — AB (ref 65–99)
Potassium: 3.8 mmol/L (ref 3.5–5.1)
SODIUM: 138 mmol/L (ref 135–145)
Total Bilirubin: 0.7 mg/dL (ref 0.3–1.2)
Total Protein: 7.7 g/dL (ref 6.5–8.1)

## 2016-03-04 LAB — CBC
HCT: 41.8 % (ref 36.0–46.0)
Hemoglobin: 14.1 g/dL (ref 12.0–15.0)
MCH: 28.7 pg (ref 26.0–34.0)
MCHC: 33.7 g/dL (ref 30.0–36.0)
MCV: 85 fL (ref 78.0–100.0)
PLATELETS: 176 10*3/uL (ref 150–400)
RBC: 4.92 MIL/uL (ref 3.87–5.11)
RDW: 12.8 % (ref 11.5–15.5)
WBC: 6.2 10*3/uL (ref 4.0–10.5)

## 2016-03-04 LAB — LIPASE, BLOOD: LIPASE: 21 U/L (ref 11–51)

## 2016-03-04 NOTE — ED Triage Notes (Signed)
C/o sharp pain to center of chest since 8am with sob, nausea, and vomiting.  Also reports upper abd pain since last night with a history of intermittent upper abd pain x 1 year.

## 2016-03-05 ENCOUNTER — Emergency Department (HOSPITAL_COMMUNITY): Payer: Medicaid Other

## 2016-03-05 ENCOUNTER — Emergency Department (HOSPITAL_COMMUNITY)
Admission: EM | Admit: 2016-03-05 | Discharge: 2016-03-05 | Disposition: A | Discharge status: Home / Self Care | Payer: Medicaid Other | Attending: Emergency Medicine | Admitting: Emergency Medicine

## 2016-03-05 DIAGNOSIS — R109 Unspecified abdominal pain: Secondary | ICD-10-CM

## 2016-03-05 DIAGNOSIS — R0789 Other chest pain: Secondary | ICD-10-CM

## 2016-03-05 DIAGNOSIS — K8020 Calculus of gallbladder without cholecystitis without obstruction: Secondary | ICD-10-CM

## 2016-03-05 LAB — I-STAT TROPONIN, ED: TROPONIN I, POC: 0 ng/mL (ref 0.00–0.08)

## 2016-03-05 MED ORDER — PANTOPRAZOLE SODIUM 40 MG IV SOLR
40.0000 mg | Freq: Once | INTRAVENOUS | Status: AC
  Administered 2016-03-05: 40 mg via INTRAVENOUS
  Filled 2016-03-05: qty 40

## 2016-03-05 MED ORDER — MORPHINE SULFATE (PF) 4 MG/ML IV SOLN
4.0000 mg | Freq: Once | INTRAVENOUS | Status: AC
  Administered 2016-03-05: 4 mg via INTRAVENOUS
  Filled 2016-03-05: qty 1

## 2016-03-05 MED ORDER — ACETAMINOPHEN 500 MG PO TABS
1000.0000 mg | ORAL_TABLET | Freq: Once | ORAL | Status: AC
  Administered 2016-03-05: 1000 mg via ORAL
  Filled 2016-03-05: qty 2

## 2016-03-05 MED ORDER — ONDANSETRON HCL 4 MG/2ML IJ SOLN
4.0000 mg | Freq: Once | INTRAMUSCULAR | Status: AC
  Administered 2016-03-05: 4 mg via INTRAVENOUS
  Filled 2016-03-05: qty 2

## 2016-03-05 MED ORDER — OXYCODONE-ACETAMINOPHEN 5-325 MG PO TABS: 1.0000 | ORAL_TABLET | Freq: Four times a day (QID) | ORAL | 0 refills | Status: DC | PRN

## 2016-03-05 MED ORDER — SODIUM CHLORIDE 0.9 % IV BOLUS (SEPSIS)
1000.0000 mL | Freq: Once | INTRAVENOUS | Status: AC
  Administered 2016-03-05: 1000 mL via INTRAVENOUS

## 2016-03-05 MED ORDER — ONDANSETRON 4 MG PO TBDP: 4.0000 mg | ORAL_TABLET | Freq: Three times a day (TID) | ORAL | 0 refills | Status: DC | PRN

## 2016-03-05 NOTE — Discharge Instructions (Signed)
You were seen today for chest and abdominal pain. You have gallstones. This may be contributing to some of your symptoms. Follow-up with general surgery if symptoms persist.

## 2016-03-05 NOTE — ED Provider Notes (Signed)
[facility] DEPT Provider Note   CSN: [id number] Arrival date & time: 03/04/16  2209 By signing my name below, I, Georgette Shell, attest that this documentation has been prepared under the direction and in the presence of Merryl Hacker, MD. Electronically Signed: Georgette Shell, ED Scribe. 03/05/16. 2:59 AM.  History   Chief Complaint Chief Complaint  Patient presents with  . Chest Pain  . Abdominal Pain    HPI The history is provided by the patient. No language interpreter was used.   HPI Comments: Regina Sparks is a 56 y.o. female with h/o PTSD, GERD, abd pain, and chest pain, who presents to the Emergency Department complaining of sharp, centralized chest pain onset 8 am yesterday. Pt also has associated intermittent upper abdominal pain, headache, shortness of breath, nausea, and vomiting. Pt has h/o chronic abdominal pain and chest pain but she states that her pain at this time is worse. Pt has not tried any OTC medications PTA. Denies fever, chills, or any other associated symptoms. Patient reports that she has had symptoms similar to this for over 10 years. She states that she's been to multiple hospitals without definitive diagnosis. She has not followed up with cardiology. She does report having a recent colonoscopy.  Past Medical History:  Diagnosis Date  . SOB (shortness of breath)     Patient Active Problem List   Diagnosis Date Noted  . Post traumatic stress disorder 01/11/2015  . Abdominal pain 01/10/2015  . Chest pain 08/04/2014  . GERD (gastroesophageal reflux disease) 08/04/2014    History reviewed. No pertinent surgical history.  OB History    No data available       Home Medications    Prior to Admission medications   Medication Sig Start Date End Date Taking? Authorizing Provider  metroNIDAZOLE (FLAGYL) 250 MG tablet Take 2 tablets (500 mg total) by mouth 4 (four) times daily. 05/17/15   Milus Banister, MD  omeprazole (PRILOSEC) 40 MG capsule Take 1  capsule (40 mg total) by mouth daily. 07/19/15   Milus Banister, MD  ondansetron (ZOFRAN ODT) 4 MG disintegrating tablet Take 1 tablet (4 mg total) by mouth every 8 (eight) hours as needed for nausea or vomiting. 03/05/16   Merryl Hacker, MD  oxyCODONE-acetaminophen (PERCOCET/ROXICET) 5-325 MG tablet Take 1 tablet by mouth every 6 (six) hours as needed for severe pain. 03/05/16   Merryl Hacker, MD  tetracycline (ACHROMYCIN,SUMYCIN) 500 MG capsule Take 1 capsule (500 mg total) by mouth 4 (four) times daily. 05/17/15   Milus Banister, MD    Family History Family History  Problem Relation Age of Onset  . Diabetes Mother   . Cancer Father     in the GI tract  . Stomach cancer Father   . Colon cancer Neg Hx     Social History Social History  Substance Use Topics  . Smoking status: Never Smoker  . Smokeless tobacco: Never Used  . Alcohol use No     Allergies   Patient has no known allergies.   Review of Systems Review of Systems  Constitutional: Negative for chills and fever.  Respiratory: Negative for shortness of breath.   Cardiovascular: Positive for chest pain.  Gastrointestinal: Positive for abdominal pain, nausea and vomiting.  Neurological: Positive for headaches.  All other systems reviewed and are negative.    Physical Exam Updated Vital Signs BP 103/66   Pulse 63   Temp 98.6 F (37 C) (Oral)   Resp Marland Kitchen)  27   LMP 06/25/2014   SpO2 100%   Physical Exam  Constitutional: She is oriented to person, place, and time. She appears well-developed and well-nourished.  HENT:  Head: Normocephalic and atraumatic.  Cardiovascular: Normal rate, regular rhythm and normal heart sounds.   Pulmonary/Chest: Effort normal and breath sounds normal. No respiratory distress. She has no wheezes.  Abdominal: Soft. Bowel sounds are normal. There is no rebound and no guarding.  Epigastric and right upper quadrant tenderness to palpation, no rebound or guarding, no signs of  peritonitis  Neurological: She is alert and oriented to person, place, and time.  Skin: Skin is warm and dry.  Psychiatric: She has a normal mood and affect.  Nursing note and vitals reviewed.    ED Treatments / Results  DIAGNOSTIC STUDIES: Oxygen Saturation is 100% on RA, normal by my interpretation.    COORDINATION OF CARE: 2:59 AM Discussed treatment plan with pt at bedside and pt agreed to plan.  Labs (all labs ordered are listed, but only abnormal results are displayed) Labs Reviewed  COMPREHENSIVE METABOLIC PANEL - Abnormal; Notable for the following:       Result Value   Glucose, Bld 126 (*)    ALT 13 (*)    All other components within normal limits  LIPASE, BLOOD  CBC  URINALYSIS, ROUTINE W REFLEX MICROSCOPIC  I-STAT TROPOININ, ED  I-STAT TROPOININ, ED    EKG  EKG Interpretation  Date/Time:  Sunday March 04 2016 22:14:06 EST Ventricular Rate:  84 PR Interval:  130 QRS Duration: 66 QT Interval:  384 QTC Calculation: 453 R Axis:   61 Text Interpretation:  Normal sinus rhythm Normal ECG When compared with ECG of 07/09/2014, No significant change was found Confirmed by [facility]  MD, DAVID ([id number]) on 03/05/2016 12:00:36 AM       Radiology Dg Chest 2 View  Result Date: 03/04/2016 CLINICAL DATA:  Neck and chest pain EXAM: CHEST  2 VIEW COMPARISON:  Chest radiograph 07/09/2014 new FINDINGS: Normal cardiac silhouette. Chronic bronchitic markings. No effusion, infiltrate, pneumothorax. Degenerative osteophytosis of the spine. IMPRESSION: Chronic bronchitic markings.  No acute findings. Electronically Signed   By: Suzy Bouchard M.D.   On: 03/04/2016 23:13   US Abdomen Limited Ruq  Result Date: 03/05/2016 CLINICAL DATA:  Right upper quadrant pain, nausea and vomiting since 8 a.m. on 03/04/2016. EXAM: US ABDOMEN LIMITED - RIGHT UPPER QUADRANT COMPARISON:  02/23/2015 ultrasound FINDINGS: Gallbladder: Biliary sludge and a dominant 1.8 cm in diameter gallstone are again.  No sonographic Murphy's. No pericholecystic fluid nor wall thickening. The gallbladder wall ranges in size from 1.6-3.3 mm. Common bile duct: Diameter: Normal at 2.7 mm. Liver: No focal lesion identified. Slightly coarsened echotexture of the liver. IMPRESSION: Uncomplicated biliary sludge and cholelithiasis. Electronically Signed   By: Ashley Royalty M.D.   On: 03/05/2016 04:14    Procedures Procedures (including critical care time)  Medications Ordered in ED Medications  morphine 4 MG/ML injection 4 mg (4 mg Intravenous Given 03/05/16 0315)  ondansetron (ZOFRAN) injection 4 mg (4 mg Intravenous Given 03/05/16 0315)  sodium chloride 0.9 % bolus 1,000 mL (0 mLs Intravenous Stopped 03/05/16 0426)  pantoprazole (PROTONIX) injection 40 mg (40 mg Intravenous Given 03/05/16 0315)  acetaminophen (TYLENOL) tablet 1,000 mg (1,000 mg Oral Given 03/05/16 0540)     Initial Impression / Assessment and Plan / ED Course  I have reviewed the triage vital signs and the nursing notes.  Pertinent labs & imaging results that were  available during my care of the patient were reviewed by me and considered in my medical decision making (see chart for details).     Patient presents with abdominal pain, chest pain, nausea vomiting. Suspect GI etiology. Basic labwork obtained and largely reassuring. Given her right upper quadrant pain, ultrasound obtained. It does show cholelithiasis and biliary sludge without evidence of cholecystitis. Patient is able to tolerate fluids. Given reassuring workup, we'll discharge him with pain and nausea medication. Follow-up with general surgery recommended. Delta troponin negative. Very atypical for ACS.  After history, exam, and medical workup I feel the patient has been appropriately medically screened and is safe for discharge home. Pertinent diagnoses were discussed with the patient. Patient was given return precautions.   Final Clinical Impressions(s) / ED Diagnoses   Final  diagnoses:  Abdominal pain  Calculus of gallbladder without cholecystitis without obstruction  Atypical chest pain    New Prescriptions New Prescriptions   ONDANSETRON (ZOFRAN ODT) 4 MG DISINTEGRATING TABLET    Take 1 tablet (4 mg total) by mouth every 8 (eight) hours as needed for nausea or vomiting.   OXYCODONE-ACETAMINOPHEN (PERCOCET/ROXICET) 5-325 MG TABLET    Take 1 tablet by mouth every 6 (six) hours as needed for severe pain.   I personally performed the services described in this documentation, which was scribed in my presence. The recorded information has been reviewed and is accurate.     Merryl Hacker, MD 03/05/16 [phone]

## 2016-05-02 ENCOUNTER — Ambulatory Visit: Payer: Self-pay | Admitting: General Surgery

## 2016-05-17 ENCOUNTER — Encounter (HOSPITAL_COMMUNITY): Payer: Self-pay

## 2016-05-17 ENCOUNTER — Encounter (HOSPITAL_COMMUNITY)
Admission: RE | Admit: 2016-05-17 | Discharge: 2016-05-17 | Disposition: A | Discharge status: Home / Self Care | Payer: Medicaid Other | Source: Ambulatory Visit | Attending: General Surgery | Admitting: General Surgery

## 2016-05-17 DIAGNOSIS — K8020 Calculus of gallbladder without cholecystitis without obstruction: Secondary | ICD-10-CM | POA: Diagnosis not present

## 2016-05-17 DIAGNOSIS — Z01812 Encounter for preprocedural laboratory examination: Secondary | ICD-10-CM | POA: Insufficient documentation

## 2016-05-17 DIAGNOSIS — D175 Benign lipomatous neoplasm of intra-abdominal organs: Secondary | ICD-10-CM | POA: Insufficient documentation

## 2016-05-17 HISTORY — DX: Headache: R51

## 2016-05-17 HISTORY — DX: Gastro-esophageal reflux disease without esophagitis: K21.9

## 2016-05-17 HISTORY — DX: Unspecified osteoarthritis, unspecified site: M19.90

## 2016-05-17 HISTORY — DX: Headache, unspecified: R51.9

## 2016-05-17 LAB — CBC
HCT: 39.2 % (ref 36.0–46.0)
Hemoglobin: 12.9 g/dL (ref 12.0–15.0)
MCH: 28.2 pg (ref 26.0–34.0)
MCHC: 32.9 g/dL (ref 30.0–36.0)
MCV: 85.8 fL (ref 78.0–100.0)
Platelets: 214 10*3/uL (ref 150–400)
RBC: 4.57 MIL/uL (ref 3.87–5.11)
RDW: 12.5 % (ref 11.5–15.5)
WBC: 6.7 10*3/uL (ref 4.0–10.5)

## 2016-05-17 LAB — BASIC METABOLIC PANEL
Anion gap: 6 (ref 5–15)
BUN: 15 mg/dL (ref 6–20)
CO2: 27 mmol/L (ref 22–32)
CREATININE: 0.7 mg/dL (ref 0.44–1.00)
Calcium: 9.5 mg/dL (ref 8.9–10.3)
Chloride: 105 mmol/L (ref 101–111)
GFR calc non Af Amer: >60 mL/min (ref >60)
Glucose, Bld: 89 mg/dL (ref 65–99)
Potassium: 3.9 mmol/L (ref 3.5–5.1)
Sodium: 138 mmol/L (ref 135–145)

## 2016-05-17 NOTE — Pre-Procedure Instructions (Signed)
Regina Sparks  05/17/2016      [facility], [place] [address] [place] [address] Phone: [phone] Fax: [phone]    Your procedure is scheduled on 05/21/2016  Report to [facility] Admitting at 5:30 A.M.  Call this number if you have problems the morning of surgery:  [phone]   Remember:  Do not eat food or drink liquids after midnight.    Take   NO   medicines the morning of surgery    Do not wear jewelry, make-up or nail polish.   Do not wear lotions, powders, or perfumes, or deoderant.   Do not shave 48 hours prior to surgery.     Do not bring valuables to the hospital.   [facility] is not responsible for any belongings or valuables.  Contacts, dentures or bridgework may not be worn into surgery.  Leave your suitcase in the car.  After surgery it may be brought to your room.  For patients admitted to the hospital, discharge time will be determined by your treatment team.  Patients discharged the day of surgery will not be allowed to drive home.   Name and phone number of your driver:   With family   Special instructions:  Special Instructions: [facility] - Preparing for Surgery  Before surgery, you can play an important role.  Because skin is not sterile, your skin needs to be as free of germs as possible.  You can reduce the number of germs on you skin by washing with CHG (chlorahexidine gluconate) soap before surgery.  CHG is an antiseptic cleaner which kills germs and bonds with the skin to continue killing germs even after washing.  Please DO NOT use if you have an allergy to CHG or antibacterial soaps.  If your skin becomes reddened/irritated stop using the CHG and inform your nurse when you arrive at Short Stay.  Do not shave (including legs and underarms) for at least 48 hours prior to the first CHG shower.  You may shave your face.  Please follow these instructions  carefully:   1.  Shower with CHG Soap the night before surgery and the  morning of Surgery.  2.  If you choose to wash your hair, wash your hair first as usual with your  normal shampoo.  3.  After you shampoo, rinse your hair and body thoroughly to remove the  Shampoo.  4.  Use CHG as you would any other liquid soap.  You can apply chg directly to the skin and wash gently with scrungie or a clean washcloth.  5.  Apply the CHG Soap to your body ONLY FROM THE NECK DOWN.    Do not use on open wounds or open sores.  Avoid contact with your eyes, ears, mouth and genitals (private parts).  Wash genitals (private parts)   with your normal soap.  6.  Wash thoroughly, paying special attention to the area where your surgery will be performed.  7.  Thoroughly rinse your body with warm water from the neck down.  8.  DO NOT shower/wash with your normal soap after using and rinsing off   the CHG Soap.  9.  Pat yourself dry with a clean towel.            10.  Wear clean pajamas.            11.  Place clean sheets on your bed  the night of your first shower and do not sleep with pets.  Day of Surgery  Do not apply any lotions/deodorants the morning of surgery.  Please wear clean clothes to the hospital/surgery center.  Please read over the following fact sheets that you were given. Pain Booklet, Coughing and Deep Breathing and Surgical Site Infection Prevention

## 2016-05-17 NOTE — Progress Notes (Signed)
Pt. Reports that she has stabbing pain in her sternal area, she states that the "knife like feeling" is related to the gallbladder. Pt. Smiling most of the visit but she reports the pain is a score of 9-10. Pt. Has lived  In the [place] for 20 yrs. But has had limited medical care, currently- followed at [facility].

## 2016-05-21 ENCOUNTER — Ambulatory Visit (HOSPITAL_COMMUNITY)
Admission: RE | Admit: 2016-05-21 | Discharge: 2016-05-21 | Disposition: A | Discharge status: Home / Self Care | Payer: Medicaid Other | Source: Ambulatory Visit | Attending: General Surgery | Admitting: General Surgery

## 2016-05-21 ENCOUNTER — Ambulatory Visit (HOSPITAL_COMMUNITY): Payer: Medicaid Other | Admitting: Anesthesiology

## 2016-05-21 ENCOUNTER — Encounter (HOSPITAL_COMMUNITY): Payer: Self-pay | Admitting: Critical Care Medicine

## 2016-05-21 ENCOUNTER — Encounter (HOSPITAL_COMMUNITY)
Admission: RE | Disposition: A | Discharge status: Home / Self Care | Payer: Self-pay | Source: Ambulatory Visit | Attending: General Surgery

## 2016-05-21 DIAGNOSIS — K8020 Calculus of gallbladder without cholecystitis without obstruction: Secondary | ICD-10-CM | POA: Diagnosis present

## 2016-05-21 DIAGNOSIS — Z79899 Other long term (current) drug therapy: Secondary | ICD-10-CM | POA: Insufficient documentation

## 2016-05-21 DIAGNOSIS — D171 Benign lipomatous neoplasm of skin and subcutaneous tissue of trunk: Secondary | ICD-10-CM | POA: Insufficient documentation

## 2016-05-21 DIAGNOSIS — K8010 Calculus of gallbladder with chronic cholecystitis without obstruction: Secondary | ICD-10-CM | POA: Insufficient documentation

## 2016-05-21 HISTORY — PX: CHOLECYSTECTOMY: SHX55

## 2016-05-21 HISTORY — PX: LIPOMA EXCISION: SHX5283

## 2016-05-21 SURGERY — LAPAROSCOPIC CHOLECYSTECTOMY WITH INTRAOPERATIVE CHOLANGIOGRAM
Anesthesia: General | Site: Abdomen

## 2016-05-21 MED ORDER — ROCURONIUM BROMIDE 100 MG/10ML IV SOLN
INTRAVENOUS | Status: DC | PRN
  Administered 2016-05-21: 50 mg via INTRAVENOUS
  Administered 2016-05-21: 10 mg via INTRAVENOUS

## 2016-05-21 MED ORDER — FENTANYL CITRATE (PF) 250 MCG/5ML IJ SOLN
INTRAMUSCULAR | Status: AC
  Filled 2016-05-21: qty 5

## 2016-05-21 MED ORDER — BUPIVACAINE HCL (PF) 0.25 % IJ SOLN
INTRAMUSCULAR | Status: DC | PRN
  Administered 2016-05-21: 24 mL

## 2016-05-21 MED ORDER — HYDROMORPHONE HCL 1 MG/ML IJ SOLN
INTRAMUSCULAR | Status: AC
  Filled 2016-05-21: qty 0.5

## 2016-05-21 MED ORDER — LACTATED RINGERS IV SOLN
INTRAVENOUS | Status: DC | PRN
  Administered 2016-05-21: 07:00:00 via INTRAVENOUS

## 2016-05-21 MED ORDER — ONDANSETRON HCL 4 MG/2ML IJ SOLN
INTRAMUSCULAR | Status: DC | PRN
  Administered 2016-05-21: 4 mg via INTRAVENOUS

## 2016-05-21 MED ORDER — PROMETHAZINE HCL 25 MG/ML IJ SOLN: 6.2500 mg | INTRAMUSCULAR | Status: DC | PRN

## 2016-05-21 MED ORDER — OXYCODONE HCL 5 MG PO TABS: 5.0000 mg | ORAL_TABLET | ORAL | 0 refills | Status: DC | PRN

## 2016-05-21 MED ORDER — MIDAZOLAM HCL 2 MG/2ML IJ SOLN
INTRAMUSCULAR | Status: AC
  Filled 2016-05-21: qty 2

## 2016-05-21 MED ORDER — MIDAZOLAM HCL 5 MG/5ML IJ SOLN
INTRAMUSCULAR | Status: DC | PRN
  Administered 2016-05-21: 2 mg via INTRAVENOUS

## 2016-05-21 MED ORDER — 0.9 % SODIUM CHLORIDE (POUR BTL) OPTIME
TOPICAL | Status: DC | PRN
  Administered 2016-05-21: 1000 mL

## 2016-05-21 MED ORDER — DEXAMETHASONE SODIUM PHOSPHATE 10 MG/ML IJ SOLN
INTRAMUSCULAR | Status: DC | PRN
  Administered 2016-05-21: 10 mg via INTRAVENOUS

## 2016-05-21 MED ORDER — IOPAMIDOL (ISOVUE-300) INJECTION 61%
INTRAVENOUS | Status: AC
  Filled 2016-05-21: qty 50

## 2016-05-21 MED ORDER — KETOROLAC TROMETHAMINE 30 MG/ML IJ SOLN
INTRAMUSCULAR | Status: DC | PRN
  Administered 2016-05-21: 30 mg via INTRAVENOUS

## 2016-05-21 MED ORDER — PROPOFOL 10 MG/ML IV BOLUS
INTRAVENOUS | Status: AC
  Filled 2016-05-21: qty 20

## 2016-05-21 MED ORDER — DEXAMETHASONE SODIUM PHOSPHATE 10 MG/ML IJ SOLN
INTRAMUSCULAR | Status: AC
  Filled 2016-05-21: qty 1

## 2016-05-21 MED ORDER — SODIUM CHLORIDE 0.9 % IR SOLN
Status: DC | PRN
  Administered 2016-05-21: 1

## 2016-05-21 MED ORDER — BUPIVACAINE HCL (PF) 0.25 % IJ SOLN
INTRAMUSCULAR | Status: AC
  Filled 2016-05-21: qty 30

## 2016-05-21 MED ORDER — HYDROMORPHONE HCL 1 MG/ML IJ SOLN
0.2500 mg | INTRAMUSCULAR | Status: DC | PRN
  Administered 2016-05-21 (×3): 0.5 mg via INTRAVENOUS

## 2016-05-21 MED ORDER — FENTANYL CITRATE (PF) 100 MCG/2ML IJ SOLN
INTRAMUSCULAR | Status: DC | PRN
  Administered 2016-05-21 (×7): 50 ug via INTRAVENOUS

## 2016-05-21 MED ORDER — CHLORHEXIDINE GLUCONATE CLOTH 2 % EX PADS: 6.0000 | MEDICATED_PAD | Freq: Once | CUTANEOUS | Status: DC

## 2016-05-21 MED ORDER — SUGAMMADEX SODIUM 200 MG/2ML IV SOLN
INTRAVENOUS | Status: DC | PRN
  Administered 2016-05-21: 200 mg via INTRAVENOUS

## 2016-05-21 MED ORDER — HYDROMORPHONE HCL 1 MG/ML IJ SOLN
INTRAMUSCULAR | Status: AC
  Administered 2016-05-21: 0.5 mg via INTRAVENOUS
  Filled 2016-05-21: qty 1

## 2016-05-21 MED ORDER — LIDOCAINE HCL (CARDIAC) 20 MG/ML IV SOLN
INTRAVENOUS | Status: DC | PRN
  Administered 2016-05-21: 100 mg via INTRAVENOUS

## 2016-05-21 MED ORDER — PROPOFOL 10 MG/ML IV BOLUS
INTRAVENOUS | Status: DC | PRN
  Administered 2016-05-21: 150 mg via INTRAVENOUS
  Administered 2016-05-21: 30 mg via INTRAVENOUS

## 2016-05-21 MED ORDER — CEFAZOLIN SODIUM-DEXTROSE 2-4 GM/100ML-% IV SOLN
2.0000 g | INTRAVENOUS | Status: AC
  Administered 2016-05-21: 2 g via INTRAVENOUS
  Filled 2016-05-21: qty 100

## 2016-05-21 SURGICAL SUPPLY — 55 items
APPLIER CLIP 5 13 M/L LIGAMAX5 (MISCELLANEOUS) ×4
BLADE CLIPPER SURG (BLADE) IMPLANT
CANISTER SUCT 3000ML PPV (MISCELLANEOUS) ×4 IMPLANT
CHLORAPREP W/TINT 26ML (MISCELLANEOUS) ×4 IMPLANT
CLIP APPLIE 5 13 M/L LIGAMAX5 (MISCELLANEOUS) ×2 IMPLANT
COVER MAYO STAND STRL (DRAPES) ×4 IMPLANT
COVER SURGICAL LIGHT HANDLE (MISCELLANEOUS) ×4 IMPLANT
DECANTER SPIKE VIAL GLASS SM (MISCELLANEOUS) ×4 IMPLANT
DERMABOND ADVANCED (GAUZE/BANDAGES/DRESSINGS) ×4
DERMABOND ADVANCED .7 DNX12 (GAUZE/BANDAGES/DRESSINGS) ×4 IMPLANT
DRAPE C-ARM 42X72 X-RAY (DRAPES) ×4 IMPLANT
DRAPE LAPAROSCOPIC ABDOMINAL (DRAPES) IMPLANT
DRAPE LAPAROTOMY 100X72 PEDS (DRAPES) IMPLANT
DRAPE UTILITY XL STRL (DRAPES) ×8 IMPLANT
ELECT REM PT RETURN 9FT ADLT (ELECTROSURGICAL) ×4
ELECTRODE REM PT RTRN 9FT ADLT (ELECTROSURGICAL) ×2 IMPLANT
FILTER SMOKE EVAC LAPAROSHD (FILTER) IMPLANT
GAUZE SPONGE 4X4 12PLY STRL (GAUZE/BANDAGES/DRESSINGS) ×4 IMPLANT
GLOVE BIO SURGEON STRL SZ8 (GLOVE) ×4 IMPLANT
GLOVE BIOGEL PI IND STRL 8 (GLOVE) ×2 IMPLANT
GLOVE BIOGEL PI INDICATOR 8 (GLOVE) ×2
GOWN STRL REUS W/ TWL LRG LVL3 (GOWN DISPOSABLE) ×4 IMPLANT
GOWN STRL REUS W/ TWL XL LVL3 (GOWN DISPOSABLE) ×2 IMPLANT
GOWN STRL REUS W/TWL LRG LVL3 (GOWN DISPOSABLE) ×4
GOWN STRL REUS W/TWL XL LVL3 (GOWN DISPOSABLE) ×2
KIT BASIN OR (CUSTOM PROCEDURE TRAY) ×4 IMPLANT
KIT ROOM TURNOVER OR (KITS) ×4 IMPLANT
L-HOOK LAP DISP 36CM (ELECTROSURGICAL) ×4
LHOOK LAP DISP 36CM (ELECTROSURGICAL) ×2 IMPLANT
NEEDLE 22X1 1/2 (OR ONLY) (NEEDLE) ×4 IMPLANT
NS IRRIG 1000ML POUR BTL (IV SOLUTION) ×4 IMPLANT
PACK GENERAL/GYN (CUSTOM PROCEDURE TRAY) ×4 IMPLANT
PAD ARMBOARD 7.5X6 YLW CONV (MISCELLANEOUS) ×8 IMPLANT
PENCIL BUTTON HOLSTER BLD 10FT (ELECTRODE) ×4 IMPLANT
POUCH RETRIEVAL ECOSAC 10 (ENDOMECHANICALS) ×2 IMPLANT
POUCH RETRIEVAL ECOSAC 10MM (ENDOMECHANICALS) ×2
SCISSORS LAP 5X35 DISP (ENDOMECHANICALS) ×4 IMPLANT
SET CHOLANGIOGRAPH 5 50 .035 (SET/KITS/TRAYS/PACK) ×4 IMPLANT
SET IRRIG TUBING LAPAROSCOPIC (IRRIGATION / IRRIGATOR) ×4 IMPLANT
SLEEVE ENDOPATH XCEL 5M (ENDOMECHANICALS) ×8 IMPLANT
SPECIMEN JAR MEDIUM (MISCELLANEOUS) ×4 IMPLANT
SPECIMEN JAR SMALL (MISCELLANEOUS) ×4 IMPLANT
SUT MNCRL AB 4-0 PS2 18 (SUTURE) ×4 IMPLANT
SUT VIC AB 3-0 SH 27 (SUTURE) ×2
SUT VIC AB 3-0 SH 27X BRD (SUTURE) ×2 IMPLANT
SUT VIC AB 4-0 PS2 27 (SUTURE) ×4 IMPLANT
SUT VICRYL AB 3 0 TIES (SUTURE) IMPLANT
SYR CONTROL 10ML LL (SYRINGE) IMPLANT
TOWEL OR 17X24 6PK STRL BLUE (TOWEL DISPOSABLE) ×4 IMPLANT
TOWEL OR 17X26 10 PK STRL BLUE (TOWEL DISPOSABLE) ×4 IMPLANT
TRAY LAPAROSCOPIC MC (CUSTOM PROCEDURE TRAY) ×4 IMPLANT
TROCAR XCEL BLUNT TIP 100MML (ENDOMECHANICALS) ×4 IMPLANT
TROCAR XCEL NON-BLD 5MMX100MML (ENDOMECHANICALS) ×4 IMPLANT
TUBING INSUFFLATION (TUBING) ×4 IMPLANT
WATER STERILE IRR 1000ML POUR (IV SOLUTION) IMPLANT

## 2016-05-21 NOTE — Op Note (Signed)
05/21/2016  8:49 AM  PATIENT:  Regina Sparks  56 y.o. female  PRE-OPERATIVE DIAGNOSIS:  symptomatic cholelithiasis lipoma left abdominal wall  POST-OPERATIVE DIAGNOSIS:  Chronic cholecystitis, lipoma left abdominal wall  PROCEDURE:  Procedure(s): LAPAROSCOPIC CHOLECYSTECTOMY EXCISION LIPOMA LEFT ABDOMINAL WALL 4CM WITH LAYERED CLOSURE  SURGEON:  Surgeon(s): Georganna Skeans, MD  ASSISTANTS: none   ANESTHESIA:   local and general  EBL:  No intake/output data recorded.  BLOOD ADMINISTERED:none  DRAINS: none   SPECIMEN:  Excision  DISPOSITION OF SPECIMEN:  PATHOLOGY  COUNTS:  YES  DICTATION: .Dragon Dictation Findings: Partially encapsulated fatty tumor left abdominal wall, chronic cholecystitis  Procedure in detail: Regina Sparks presents for excision of lipoma left abdominal wall, laparoscopic cholecystectomy comment: Range exam. She was identified in the preop holding area. Informed consent was obtained. She received intravenous and a box. She was brought to the operating room and general endotracheal anesthesia was administered by the anesthesia staff. Her abdomen was prepped and draped in a sterile fashion. We did a time out procedure. Attention was first directed to the lipoma. Abdomen was prepped and draped in a sterile fashion. Local anesthetic was injected over the palpable mass in her lateral left upper quadrant. Overlying incision was made. Subcutaneous tissues were dissected down revealing a partially encapsulated fatty tumor. This was circumferentially dissected using cautery. Hemostasis was obtained. It was removed completely and sent to pathology. Wound was irrigated. Subcutaneous tissues were closed with interrupted 3-0 Vicryl. Skin was closed with running 4-0 Monocryl subcuticular stitch followed by Dermabond. Counts were correct for this portion of the procedure.  Attention was directed to the cholecystectomy. The patient's abdomen was reprepped and draped in a sterile  fashion.The infraumbilical region was infiltrated with local. Infraumbilical incision was made. Subcutaneous tissues were dissected down revealing the anterior fascia. This was divided sharply along the midline. Peritoneal cavity was entered under direct vision without complication. A 0 Vicryl pursestring was placed around the fascial opening. Hassan trocar was inserted into the abdomen. The abdomen was insufflated with carbon dioxide in standard fashion. Under direct vision a 5 mm epigastric and 5 mm right lateral ports 2 were placed. Local was used at each port site. The dome the gallbladder was retracted superior medially. There was a lot of omental adhesions to the body and infundibulum of the gallbladder. These were gradually and carefully taken down further revealing the infundibulum. This was consistent with chronic cholecystitis. Dissection then began laterally and progressed medially identifying the cystic duct and anterior branch of the cystic artery. The anterior branch of the cystic artery was clipped twice proximally, once distally and divided. Further dissection clearly delineated the cystic duct with a critical view between the cystic duct, the liver, and infundibulum. The cystic duct was noted to be short. We could clearly see the junction of the cystic duct with the common bile duct. Decision was made to not perform a cholangiogram. Liver function tests were not elevated. We had clear anatomy. 3 clips were placed proximally on the cystic duct. One was placed distally and it was divided. The gallbladder was taken off the liver bed using Bovie cautery. We did also encounter a posterior branch of the cystic artery. This was small and it was clipped twice proximally and divided distally with cautery. The gallbladder was taken off the liver bed using cautery. An additional tiny artery was clipped along the liver bed. Gallbladder was removed completely. It was placed in a bag and removed from the  abdomen. It was  sent to pathology. The liver bed was cauterized getting excellent hemostasis. The area was copiously irrigated. Irrigation returned clear. Clips were all in good position. Liver bed was dry. Pneumoperitoneum was released. Ports were removed under direct vision. Informed local fascia was closed by tying the pursestring. All 4 wounds were copiously irrigated and the skin of each was closed with running 4 Vicryl subcuticular followed by Dermabond. All counts were correct. She tolerated the procedure well without apparent complications and was taken to recovery in stable condition.  PATIENT DISPOSITION:  PACU - hemodynamically stable.   Delay start of Pharmacological VTE agent (>24hrs) due to surgical blood loss or risk of bleeding:  no  Georganna Skeans, MD, MPH, FACS Pager: [phone]  4/9/20188:49 AM

## 2016-05-21 NOTE — H&P (Signed)
Regina Sparks 05/02/2016 10:30 AM Location: [facility] Patient #: [id number] DOB: 05-11-1960 Single / Language: [place] / Race: White Female   History of Present Illness Lavone Neri E. Grandville Silos MD; 05/02/2016 10:51 AM) The patient is a 56 year old female who presents for evaluation of gallbladder disease. I saw Ms. Slowey in January 2017 for symptomatically cholelithiasis and a left abdominal wall lipoma. She did not schedule at that time. She continues to have pain in her epigastrium after she eats. Additionally, the lipoma is painful on her left abdominal wall along her costal margin. This pain is exacerbated by walking or activities. She was seen in the emergency department in January of this year and another ultrasound confirmed gallstones.   Allergies Nance Pear, [place]; 05/02/2016 10:30 AM) No Known Drug Allergies 03/09/2015  Medication History Nance Pear, [place]; 05/02/2016 10:30 AM) Lactulose (10GM/15ML Solution, Oral) Active. Pantoprazole Sodium (40MG  Tablet DR, Oral) Active. MiraLax (Oral) Active. Surfak (240MG  Capsule, Oral) Active. Medications Reconciled  Vitals Bary Castilla Bradford CMA; 05/02/2016 10:31 AM) 05/02/2016 10:30 AM Weight: 204.2 lb Height: 66in Body Surface Area: 2.02 m Body Mass Index: 32.96 kg/m  Temp.: 97.110F  Pulse: 74 (Regular)  BP: 122/72 (Sitting, Left Arm, Standard)       Physical Exam Lavone Neri E. Grandville Silos MD; 05/02/2016 10:52 AM) General Mental Status-Alert. General Appearance-Consistent with stated age. Hydration-Well hydrated. Voice-Normal.  Head and Neck Head-normocephalic, atraumatic with no lesions or palpable masses. Trachea-midline. Thyroid Gland Characteristics - normal size and consistency.  Eye Eyeball - Bilateral-Extraocular movements intact. Sclera/Conjunctiva - Bilateral-No scleral icterus.  Chest and Lung Exam Chest and lung exam reveals -quiet, even and easy respiratory effort with  no use of accessory muscles and on auscultation, normal breath sounds, no adventitious sounds and normal vocal resonance. Inspection Chest Wall - Normal. Back - normal.  Cardiovascular Cardiovascular examination reveals -normal heart sounds, regular rate and rhythm with no murmurs and normal pedal pulses bilaterally.  Abdomen Inspection Inspection of the abdomen reveals - No Hernias. Palpation/Percussion Palpation and Percussion of the abdomen reveal - Soft, No Rebound tenderness, No Rigidity (guarding) and No hepatosplenomegaly. Tenderness - Left Upper Quadrant. Note: Tender 4 cm mass left upper quadrant along the costal margin, no other tenderness. Auscultation Auscultation of the abdomen reveals - Bowel sounds normal.  Neurologic Neurologic evaluation reveals -alert and oriented x 3 with no impairment of recent or remote memory. Mental Status-Normal.  Musculoskeletal Global Assessment -Note: no gross deformities.  Normal Exam - Left-Upper Extremity Strength Normal and Lower Extremity Strength Normal. Normal Exam - Right-Upper Extremity Strength Normal and Lower Extremity Strength Normal.  Lymphatic Head & Neck  General Head & Neck Lymphatics: Bilateral - Description - Normal. Axillary  General Axillary Region: Bilateral - Description - Normal. Tenderness - Non Tender. Femoral & Inguinal  Generalized Femoral & Inguinal Lymphatics: Bilateral - Description - No Generalized lymphadenopathy.    Assessment & Plan Lavone Neri E. Grandville Silos MD; 05/02/2016 10:53 AM) LIPOMA (D17.9) Impression: I have offered excision at the time of her gallbladder surgery. I discussed the procedure, risks, and benefits. SYMPTOMATIC CHOLELITHIASIS (K80.20) Impression: I have offered laparoscopic cholecystectomy with cholangiogram. Procedure, risks, and benefits were discussed in detail with her. She is agreeable and looks forward to scheduling. Once she recovers from surgery, she plans to  travel to [place] because her father has cancer. I discussed the expected postoperative course and we look forward to scheduling soon.

## 2016-05-21 NOTE — Progress Notes (Signed)
UPDATE H&P  Patient presents for excision lipoma left abdominal wall, laparoscopic cholecystectomy, cholangiogram. Her status has not changed. Stable for surgery. Procedure, risks, and benefits discussed again. Please see H&P note.  Georganna Skeans, MD, MPH, FACS Trauma: [phone] General Surgery: [phone]

## 2016-05-21 NOTE — Anesthesia Postprocedure Evaluation (Addendum)
Anesthesia Post Note  Patient: Jadynn Jerkins  Procedure(s) Performed: Procedure(s) (LRB): LAPAROSCOPIC CHOLECYSTECTOMY (N/A) EXCISION LIPOMA LEFT ABDOMINAL WALL (Left)  Patient location during evaluation: PACU Anesthesia Type: General Level of consciousness: awake and alert Pain management: pain level controlled Vital Signs Assessment: post-procedure vital signs reviewed and stable Respiratory status: spontaneous breathing, nonlabored ventilation, respiratory function stable and patient connected to nasal cannula oxygen Cardiovascular status: blood pressure returned to baseline and stable Postop Assessment: no signs of nausea or vomiting Anesthetic complications: no       Last Vitals:  Vitals:   05/21/16 1007 05/21/16 1018  BP: 125/84 136/84  Pulse:  64  Resp:    Temp:      Last Pain:  Vitals:   05/21/16 0621  TempSrc: Oral                 Vernetta Dizdarevic,JAMES TERRILL

## 2016-05-21 NOTE — Transfer of Care (Signed)
Immediate Anesthesia Transfer of Care Note  Patient: Regina Sparks  Procedure(s) Performed: Procedure(s): LAPAROSCOPIC CHOLECYSTECTOMY (N/A) EXCISION LIPOMA LEFT ABDOMINAL WALL (Left)  Patient Location: PACU  Anesthesia Type:General  Level of Consciousness: awake and alert   Airway & Oxygen Therapy: Patient Spontanous Breathing and Patient connected to nasal cannula oxygen  Post-op Assessment: Report given to RN and Post -op Vital signs reviewed and stable  Post vital signs: Reviewed and stable  Last Vitals:  Vitals:   05/21/16 0621  BP: 117/89  Pulse: 69  Resp: 18  Temp: 36.7 C    Last Pain:  Vitals:   05/21/16 0621  TempSrc: Oral         Complications: No apparent anesthesia complications

## 2016-05-21 NOTE — Interval H&P Note (Signed)
History and Physical Interval Note:  05/21/2016 7:12 AM  Regina Sparks  has presented today for surgery, with the diagnosis of symptomatic cholelithiasis lipoma left abdominal wall  The various methods of treatment have been discussed with the patient and family. After consideration of risks, benefits and other options for treatment, the patient has consented to  Procedure(s): LAPAROSCOPIC CHOLECYSTECTOMY WITH INTRAOPERATIVE CHOLANGIOGRAM (N/A) EXCISION LIPOMA LEFT ABDOMINAL WALL (Left) as a surgical intervention .  The patient's history has been reviewed, patient examined, no change in status, stable for surgery.  I have reviewed the patient's chart and labs.  Questions were answered to the patient's satisfaction.     Rubby Barbary E

## 2016-05-21 NOTE — Anesthesia Procedure Notes (Addendum)
Procedure Name: Intubation Date/Time: 05/21/2016 7:34 AM Performed by: Valda Favia Pre-anesthesia Checklist: Patient identified, Emergency Drugs available, Suction available, Patient being monitored and Timeout performed Patient Re-evaluated:Patient Re-evaluated prior to inductionOxygen Delivery Method: Circle system utilized Preoxygenation: Pre-oxygenation with 100% oxygen Intubation Type: IV induction Ventilation: Mask ventilation without difficulty Laryngoscope Size: Mac and 4 Grade View: Grade I Tube type: Oral Tube size: 7.0 mm Number of attempts: 1 Airway Equipment and Method: Stylet Placement Confirmation: ETT inserted through vocal cords under direct vision,  positive ETCO2 and breath sounds checked- equal and bilateral Secured at: 22 cm Tube secured with: Tape Dental Injury: Teeth and Oropharynx as per pre-operative assessment  Comments: DL by Wess Botts, SRNA

## 2016-05-21 NOTE — Anesthesia Preprocedure Evaluation (Signed)
Anesthesia Evaluation  Patient identified by MRN, date of birth, ID band Patient awake    Reviewed: Allergy & Precautions, NPO status , Patient's Chart, lab work & pertinent test results  Airway Mallampati: I  TM Distance: >3 FB Neck ROM: Full    Dental  (+) Teeth Intact   Pulmonary neg pulmonary ROS,    breath sounds clear to auscultation       Cardiovascular negative cardio ROS   Rhythm:Regular Rate:Normal     Neuro/Psych    GI/Hepatic Neg liver ROS, GERD  ,  Endo/Other  negative endocrine ROS  Renal/GU negative Renal ROS     Musculoskeletal   Abdominal   Peds  Hematology negative hematology ROS (+)   Anesthesia Other Findings   Reproductive/Obstetrics                             Anesthesia Physical Anesthesia Plan  ASA: I  Anesthesia Plan: General   Post-op Pain Management:    Induction: Intravenous  Airway Management Planned: Oral ETT  Additional Equipment:   Intra-op Plan:   Post-operative Plan: Extubation in OR  Informed Consent: I have reviewed the patients History and Physical, chart, labs and discussed the procedure including the risks, benefits and alternatives for the proposed anesthesia with the patient or authorized representative who has indicated his/her understanding and acceptance.   Dental advisory given  Plan Discussed with: CRNA  Anesthesia Plan Comments:         Anesthesia Quick Evaluation

## 2016-05-22 ENCOUNTER — Encounter (HOSPITAL_COMMUNITY): Payer: Self-pay | Admitting: General Surgery

## 2016-07-13 NOTE — Addendum Note (Signed)
Addendum  created 07/13/16 1330 by Minda Faas, MD   Sign clinical note    

## 2016-10-21 ENCOUNTER — Emergency Department (HOSPITAL_COMMUNITY)
Admission: EM | Admit: 2016-10-21 | Discharge: 2016-10-21 | Disposition: A | Discharge status: Home / Self Care | Payer: Medicaid Other | Attending: Emergency Medicine | Admitting: Emergency Medicine

## 2016-10-21 ENCOUNTER — Encounter (HOSPITAL_COMMUNITY): Payer: Self-pay

## 2016-10-21 DIAGNOSIS — R51 Headache: Secondary | ICD-10-CM | POA: Diagnosis not present

## 2016-10-21 DIAGNOSIS — K2950 Unspecified chronic gastritis without bleeding: Secondary | ICD-10-CM | POA: Diagnosis not present

## 2016-10-21 DIAGNOSIS — R1013 Epigastric pain: Secondary | ICD-10-CM | POA: Diagnosis present

## 2016-10-21 DIAGNOSIS — G8929 Other chronic pain: Secondary | ICD-10-CM

## 2016-10-21 DIAGNOSIS — R109 Unspecified abdominal pain: Secondary | ICD-10-CM

## 2016-10-21 LAB — CBC
HCT: 40.5 % (ref 36.0–46.0)
Hemoglobin: 13.7 g/dL (ref 12.0–15.0)
MCH: 28.9 pg (ref 26.0–34.0)
MCHC: 33.8 g/dL (ref 30.0–36.0)
MCV: 85.4 fL (ref 78.0–100.0)
PLATELETS: 172 10*3/uL (ref 150–400)
RBC: 4.74 MIL/uL (ref 3.87–5.11)
RDW: 12.4 % (ref 11.5–15.5)
WBC: 7.2 10*3/uL (ref 4.0–10.5)

## 2016-10-21 LAB — COMPREHENSIVE METABOLIC PANEL
ALT: 15 U/L (ref 14–54)
AST: 19 U/L (ref 15–41)
Albumin: 4.2 g/dL (ref 3.5–5.0)
Alkaline Phosphatase: 78 U/L (ref 38–126)
Anion gap: 8 (ref 5–15)
BILIRUBIN TOTAL: 0.9 mg/dL (ref 0.3–1.2)
BUN: 10 mg/dL (ref 6–20)
CHLORIDE: 104 mmol/L (ref 101–111)
CO2: 27 mmol/L (ref 22–32)
CREATININE: 0.67 mg/dL (ref 0.44–1.00)
Calcium: 9.6 mg/dL (ref 8.9–10.3)
GFR calc Af Amer: >60 mL/min (ref >60)
GLUCOSE: 122 mg/dL — AB (ref 65–99)
Potassium: 4 mmol/L (ref 3.5–5.1)
Sodium: 139 mmol/L (ref 135–145)
TOTAL PROTEIN: 7.8 g/dL (ref 6.5–8.1)

## 2016-10-21 LAB — URINALYSIS, ROUTINE W REFLEX MICROSCOPIC
BILIRUBIN URINE: NEGATIVE
GLUCOSE, UA: NEGATIVE mg/dL
Hgb urine dipstick: NEGATIVE
KETONES UR: NEGATIVE mg/dL
LEUKOCYTES UA: NEGATIVE
Nitrite: NEGATIVE
PROTEIN: NEGATIVE mg/dL
Specific Gravity, Urine: 1.009 (ref 1.005–1.030)
pH: 7 (ref 5.0–8.0)

## 2016-10-21 LAB — LIPASE, BLOOD: Lipase: 34 U/L (ref 11–51)

## 2016-10-21 MED ORDER — GI COCKTAIL ~~LOC~~
30.0000 mL | Freq: Once | ORAL | Status: AC
  Administered 2016-10-21: 30 mL via ORAL
  Filled 2016-10-21: qty 30

## 2016-10-21 MED ORDER — ONDANSETRON 4 MG PO TBDP
8.0000 mg | ORAL_TABLET | Freq: Once | ORAL | Status: AC
  Administered 2016-10-21: 8 mg via ORAL
  Filled 2016-10-21: qty 2

## 2016-10-21 MED ORDER — OMEPRAZOLE 20 MG PO CPDR: 20.0000 mg | DELAYED_RELEASE_CAPSULE | Freq: Every day | ORAL | 0 refills | Status: AC

## 2016-10-21 NOTE — ED Notes (Signed)
Pt reports pain in lower posterior head with photosensitivity. Pt also c/o felling hot and cold, decreased appetite X3 days, decreased sleep. Pt also reports she has had hematemesis and bloody stool X 10 years. She also states for 3 years she has had headaches that "make me loose control for 3 minutes".

## 2016-10-21 NOTE — ED Triage Notes (Signed)
Patient complains of nausea with emesis x 10 years. States that she vomits bile and has seen blood streaks in emesis. Has had gallbladder removed to hopefully resolve symptoms with no relief.

## 2016-10-21 NOTE — ED Provider Notes (Signed)
[name]   CSN: [id number] Arrival date & time: 10/21/16  1035     History   Chief Complaint Chief Complaint  Patient presents with  . Abdominal Pain  . Emesis    HPI Regina Sparks is a 56 y.o. female who presents with abdominal pain, N/V. PMH significant for GERD/gastritis and migraines. She states that she has had her symptoms on and off for 10 years. For the past 3-4 days she has had worsening epigastric pain, nausea, and blood tinged emesis. She also reports decreased appetite and bloating and reportedly has not eaten anything in 3 days. She has previously seen Dr. Ardis Hughs with GI. He has done an EGD and colonoscopy. EGD showed gastritis. Colonoscopy was remarkable for a polyp. She was switched from Omeprazole to Protonix but she states that Omeprazole is what really worked for her (although in prior notes she told providers that it wasn't and made her feel jittery). She had a cholecystectomy in April of 2018. This did not improve her symptoms. She reports feeling hot and having chills at times. She also thinks she has "pain in her kidneys". She has also been evaluated by cardiology who attributed her pain to GERD. She states she does not drink sodas but does drink lots of juice including orange and cranberry.  She also complains of a headache. It is over the back of her head and over her temples. It is consistent with prior headaches. She states she has previously had shots for her headaches but a doctor told her she should not have this any more because she is close to having a heart attack (?). She additionally states that she passes out every day - sometimes 15 minutes, sometimes 30 minutes at a time. She has been told not to drive because of this which she states she has not in over three years. Her headaches she has had for 20 years.  HPI  Past Medical History:  Diagnosis Date  . Arthritis    knees   . GERD (gastroesophageal reflux disease)   . Headache      migraine headaches in the past  . SOB (shortness of breath)     Patient Active Problem List   Diagnosis Date Noted  . Post traumatic stress disorder 01/11/2015  . Abdominal pain 01/10/2015  . Chest pain 08/04/2014  . GERD (gastroesophageal reflux disease) 08/04/2014    Past Surgical History:  Procedure Laterality Date  . CHOLECYSTECTOMY N/A 05/21/2016   Procedure: LAPAROSCOPIC CHOLECYSTECTOMY;  Surgeon: Georganna Skeans, MD;  Location: [facility];  Service: General;  Laterality: N/A;  . LIPOMA EXCISION Left 05/21/2016   Procedure: EXCISION LIPOMA LEFT ABDOMINAL WALL;  Surgeon: Georganna Skeans, MD;  Location: [facility];  Service: General;  Laterality: Left;  . NO PAST SURGERIES      OB History    No data available       Home Medications    Prior to Admission medications   Medication Sig Start Date End Date Taking? Authorizing Provider  oxyCODONE (OXY IR/ROXICODONE) 5 MG immediate release tablet Take 1-2 tablets (5-10 mg total) by mouth every 4 (four) hours as needed for moderate pain or severe pain. Patient not taking: Reported on 10/21/2016 05/21/16   Georganna Skeans, MD    Family History Family History  Problem Relation Age of Onset  . Diabetes Mother   . Cancer Father        in the GI tract  . Stomach cancer Father   . Colon  cancer Neg Hx     Social History Social History  Substance Use Topics  . Smoking status: Never Smoker  . Smokeless tobacco: Never Used  . Alcohol use No     Allergies   Patient has no known allergies.   Review of Systems Review of Systems  Constitutional: Positive for appetite change and chills. Negative for fever.  Respiratory: Negative for shortness of breath.   Cardiovascular: Negative for chest pain.  Gastrointestinal: Positive for abdominal pain, blood in stool, nausea and vomiting. Negative for constipation and diarrhea.  Genitourinary: Negative for dysuria.  Neurological: Positive for syncope and headaches.     Physical Exam Updated  Vital Signs BP 111/70   Pulse 72   Temp 98.3 F (36.8 C) (Oral)   Resp 16   Ht 5\' 4"  (1.626 m)   Wt 92.5 kg (204 lb)   LMP 06/25/2014   SpO2 99%   BMI 35.02 kg/m   Physical Exam  Constitutional: She is oriented to person, place, and time. She appears well-developed and well-nourished. No distress.  Pleasant conversant female in NAD  HENT:  Head: Normocephalic and atraumatic.  Eyes: Pupils are equal, round, and reactive to light. Conjunctivae are normal. Right eye exhibits no discharge. Left eye exhibits no discharge. No scleral icterus.  Neck: Normal range of motion.  Cardiovascular: Normal rate and regular rhythm.  Exam reveals no gallop and no friction rub.   No murmur heard. Pulmonary/Chest: Effort normal and breath sounds normal. No respiratory distress. She has no wheezes. She has no rales. She exhibits no tenderness.  Abdominal: Soft. Bowel sounds are normal. She exhibits no distension and no mass. There is tenderness (mild epigastric tenderness). There is no rebound and no guarding. No hernia.  Neurological: She is alert and oriented to person, place, and time.  Skin: Skin is warm and dry.  Psychiatric: She has a normal mood and affect. Her behavior is normal.  Nursing note and vitals reviewed.    ED Treatments / Results  Labs (all labs ordered are listed, but only abnormal results are displayed) Labs Reviewed  COMPREHENSIVE METABOLIC PANEL - Abnormal; Notable for the following:       Result Value   Glucose, Bld 122 (*)    All other components within normal limits  LIPASE, BLOOD  CBC  URINALYSIS, ROUTINE W REFLEX MICROSCOPIC    EKG  EKG Interpretation None       Radiology No results found.  Procedures Procedures (including critical care time)  Medications Ordered in ED Medications  gi cocktail (Maalox,Lidocaine,Donnatal) (30 mLs Oral Given 10/21/16 1437)  ondansetron (ZOFRAN-ODT) disintegrating tablet 8 mg (8 mg Oral Given 10/21/16 1437)     Initial  Impression / Assessment and Plan / ED Course  I have reviewed the triage vital signs and the nursing notes.  Pertinent labs & imaging results that were available during my care of the patient were reviewed by me and considered in my medical decision making (see chart for details).  56 year old female with acute on chronic epigastric pain due to gastritis and acute on chronic headache. Vitals are normal. She is very well appearing. Labs are normal. UA is normal. She was given GI cocktail and Zofran in the ED and tolerated well. I discussed with her and her son that she should follow up with her PCP regarding these issues. There is some confusion regarding whether or not Omeprazole works for her - she states it does, prior notes state it has not.  Will rx this and ask her to f/u with PCP. Also will give referral to Neurologist for her headaches. Return precautions given.  Final Clinical Impressions(s) / ED Diagnoses   Final diagnoses:  Chronic nonintractable headache, unspecified headache type  Chronic abdominal pain  Chronic gastritis, presence of bleeding unspecified, unspecified gastritis type    New Prescriptions New Prescriptions   No medications on file     Recardo Evangelist, PA-C 10/21/16 1624    Fredia Sorrow, MD 10/24/16 2215

## 2016-10-21 NOTE — Discharge Instructions (Signed)
Please avoid NSAIDs (Ibuprofen, Aleve), spicy foods, acidic foods, alcohol Take Tylenol for headaches as needed Take Omeprazole every day

## 2016-11-08 MED FILL — OMEPRAZOLE DR 20 MG CAPSULE: 20 | 30 days supply | Qty: 30 | Fill #0

## 2017-09-10 ENCOUNTER — Ambulatory Visit: Payer: Self-pay | Admitting: Family Medicine

## 2017-11-11 IMAGING — RF DG ESOPHAGUS
7 of 8 series · 15 of 17 positions shown · non-contrast
Comparison: None.

CLINICAL DATA: Difficulty swelling, gastroesophageal reflux
disease, epigastric pain, right upper quadrant pain, nausea and
vomiting. Occasional hemoptysis.

EXAM:
ESOPHOGRAM / BARIUM SWALLOW / BARIUM TABLET STUDY
TECHNIQUE: Combined double contrast and single contrast examination performed
using effervescent crystals, thick barium liquid, and thin barium
liquid. The patient was observed with fluoroscopy swallowing a 13 mm
barium sulphate tablet.
FLUOROSCOPY TIME:  Radiation Exposure Index (as provided by the
fluoroscopic device): 7 mGy
If the device does not provide the exposure index:
Fluoroscopy Time:  1 minutes 6 seconds.
Number of Acquired Images:  None.

[Series 1: cp_standard · 0.34mm/px · 4 of 149 frames shown (1 of 7)]
[frame 23/149]
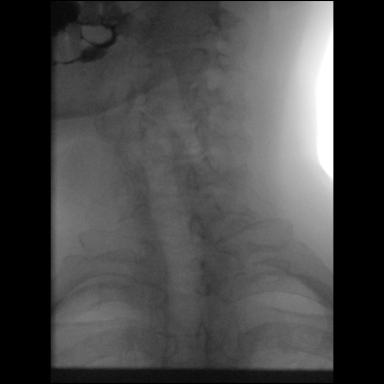
[frame 75/149]
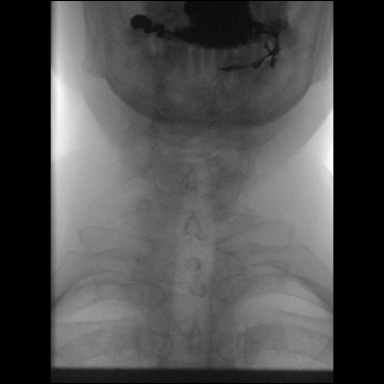
[frame 123/149]
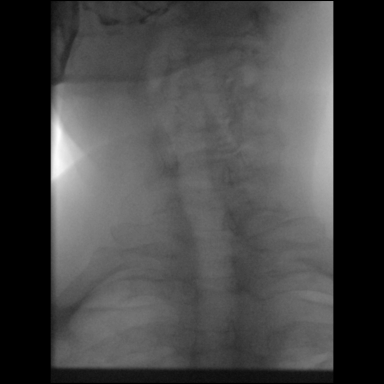
[frame 127/149]
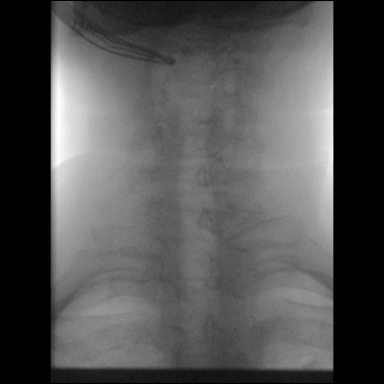

[Series 3: cp_standard · 0.34mm/px · 4 of 42 frames shown (2 of 7)]
[frame 1/42]
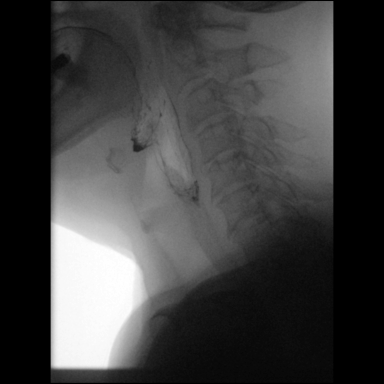
[frame 7/42]
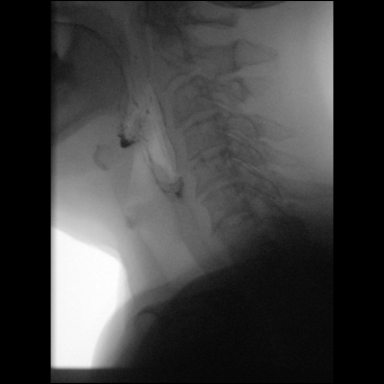
[frame 22/42]
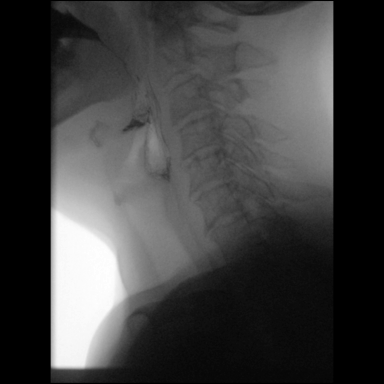
[frame 36/42]
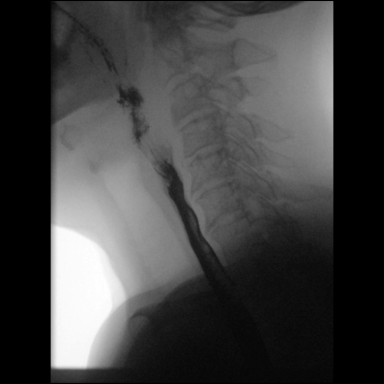

[Series 4: cp_standard · 0.17mm/px · 1 of 1 slices shown (3 of 7)]
[im 1/1]
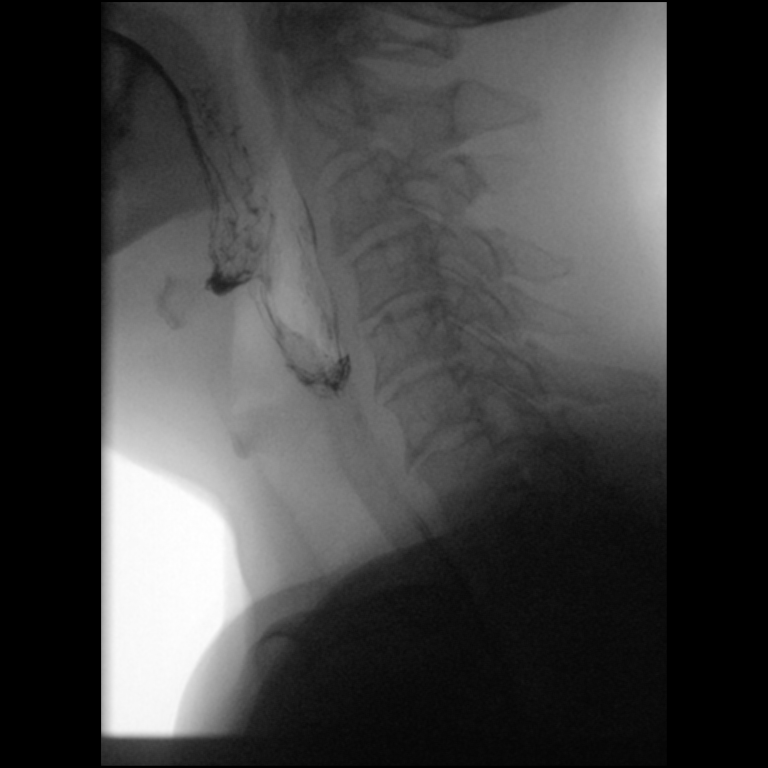

[Series 5: cp_standard · 0.51mm/px · 3 of 36 frames shown (4 of 7)]
[frame 6/36]
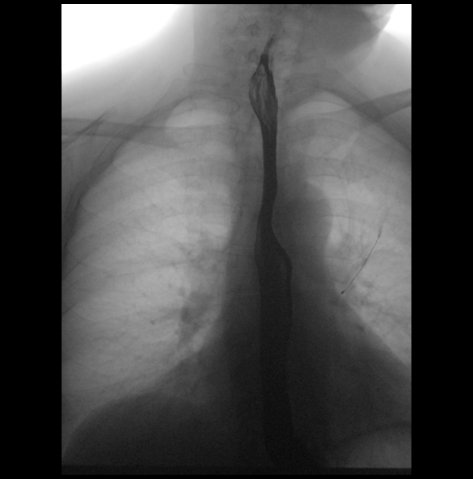
[frame 19/36]
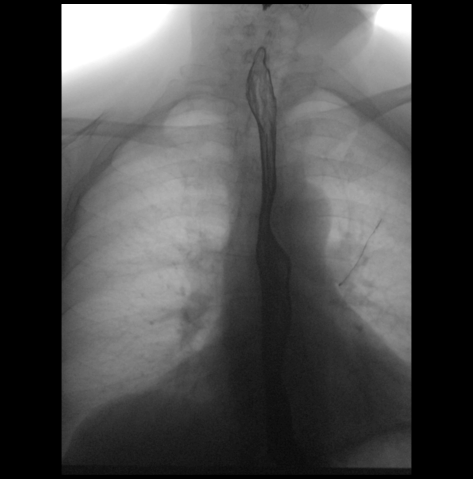
[frame 33/36]
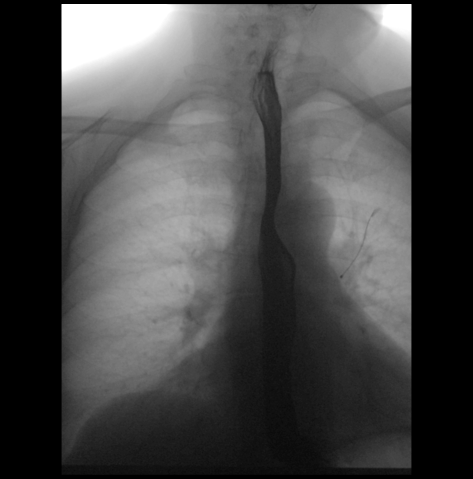

[Series 6: cp_standard · 0.26mm/px · 1 of 1 slices shown (5 of 7)]
[im 1/1]
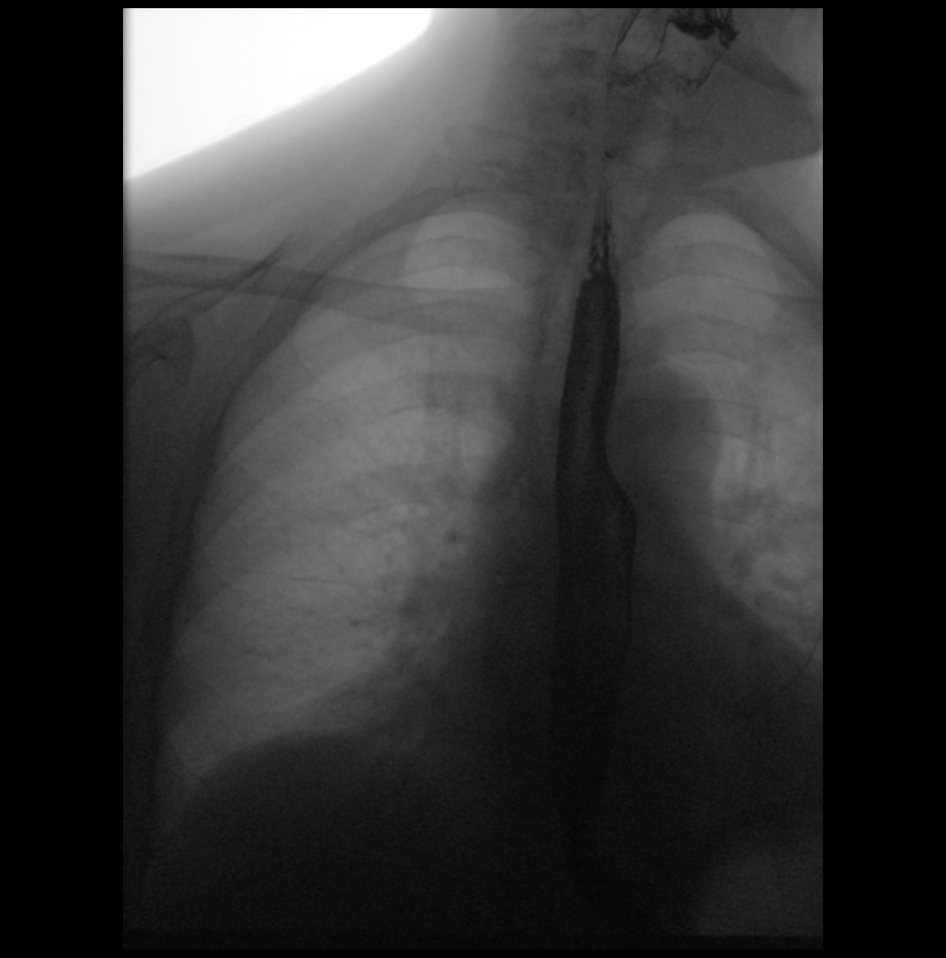

[Series 7: cp_standard · 0.29mm/px · 1 of 1 slices shown (6 of 7)]
[im 1/1]
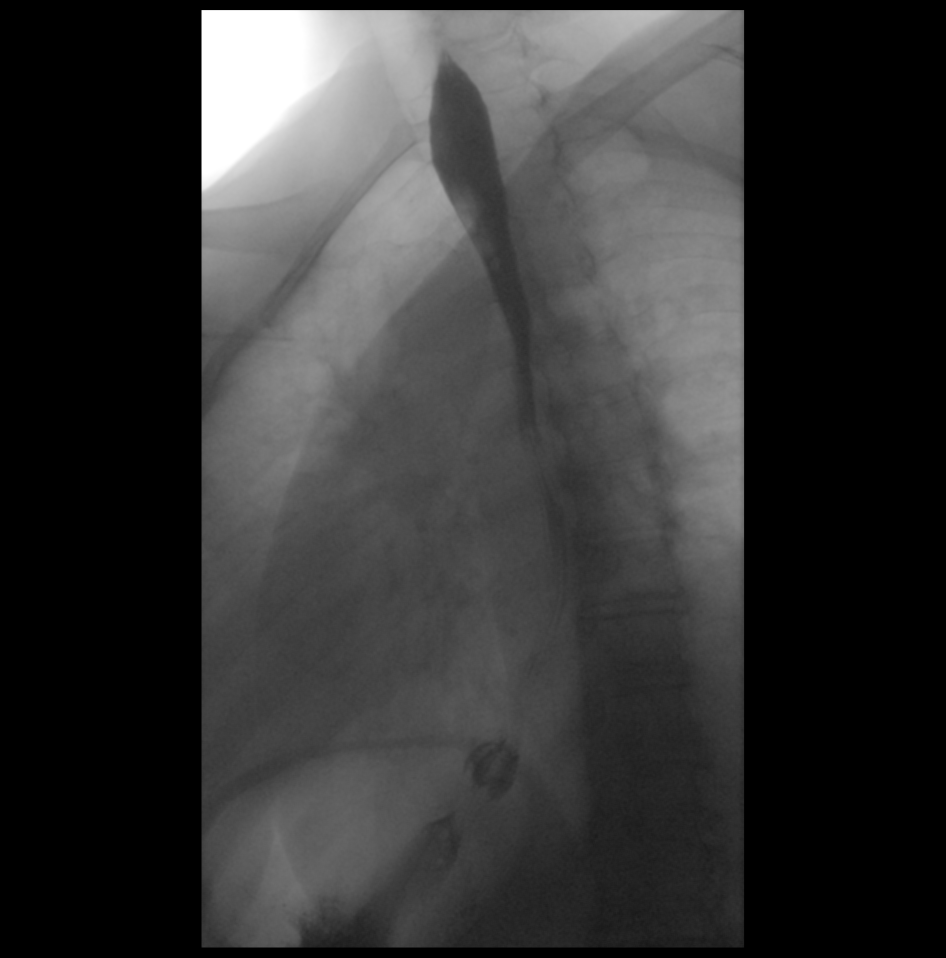

[Series 8: cp_standard · 0.29mm/px · 1 of 1 slices shown (7 of 7)]
[im 1/1]
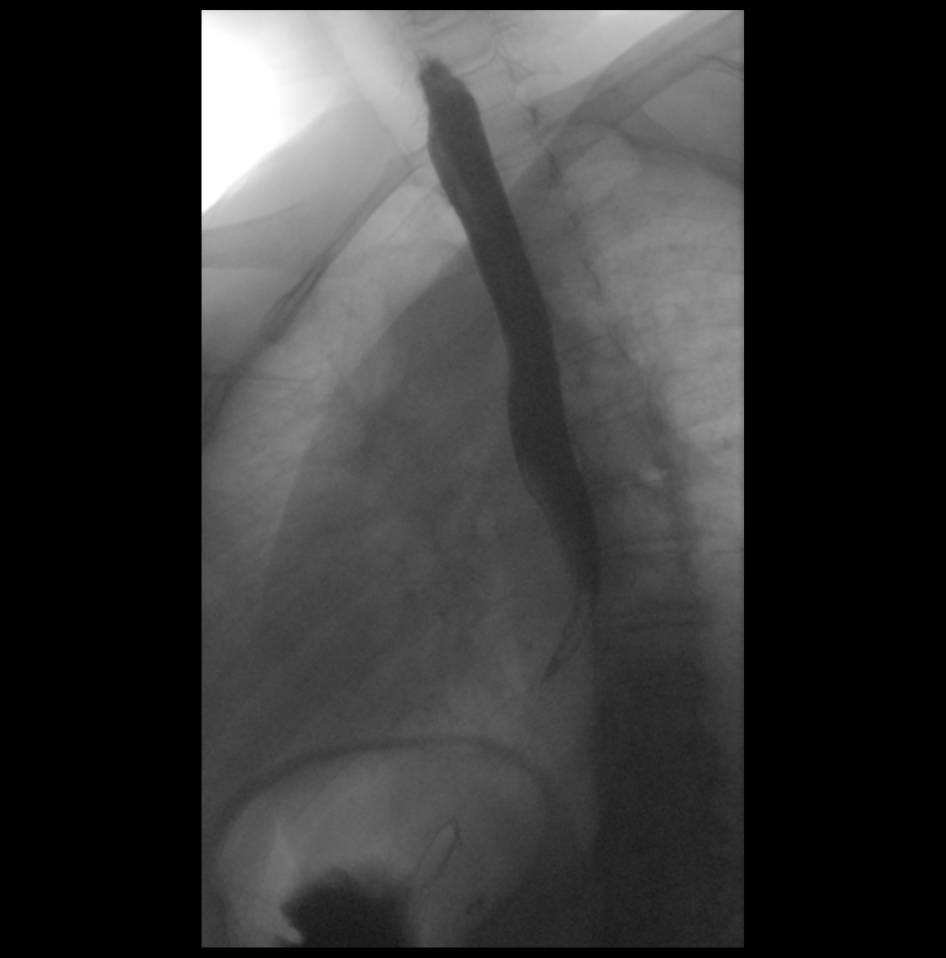

[15 of 17 positions shown; findings below may reference images not displayed]

FINDINGS: Swallowing mechanism is normal. Esophageal motility is largely
within normal limits. No esophageal fold thickening, stricture or
obstruction. Small hiatal hernia. A 13 mm barium tablet passed
readily into the stomach.
IMPRESSION: Tiny hiatal hernia.

## 2021-03-09 ENCOUNTER — Other Ambulatory Visit: Payer: Self-pay | Admitting: Internal Medicine

## 2021-03-09 DIAGNOSIS — R9389 Abnormal findings on diagnostic imaging of other specified body structures: Secondary | ICD-10-CM

## 2021-04-04 ENCOUNTER — Inpatient Hospital Stay: Admission: RE | Admit: 2021-04-04 | Payer: Medicare Other | Source: Ambulatory Visit

## 2021-05-15 ENCOUNTER — Other Ambulatory Visit: Payer: Medicare Other

## 2021-07-05 ENCOUNTER — Ambulatory Visit
Admission: RE | Admit: 2021-07-05 | Discharge: 2021-07-05 | Disposition: A | Discharge status: Home / Self Care | Payer: Medicare Other | Source: Ambulatory Visit | Attending: Internal Medicine | Admitting: Internal Medicine

## 2021-07-05 DIAGNOSIS — R9389 Abnormal findings on diagnostic imaging of other specified body structures: Secondary | ICD-10-CM
